# Patient Record
Sex: Female | Born: 1985 | Race: White | Hispanic: No | Marital: Married | Smoking: Never smoker
Health system: Southern US, Community
[De-identification: ages and names within clinical notes are randomized; demographics above are authoritative.]

## PROBLEM LIST (undated history)

## (undated) DIAGNOSIS — Z789 Other specified health status: Secondary | ICD-10-CM

## (undated) DIAGNOSIS — Z309 Encounter for contraceptive management, unspecified: Secondary | ICD-10-CM

## (undated) DIAGNOSIS — R519 Headache, unspecified: Secondary | ICD-10-CM

## (undated) DIAGNOSIS — J45909 Unspecified asthma, uncomplicated: Secondary | ICD-10-CM

## (undated) DIAGNOSIS — K219 Gastro-esophageal reflux disease without esophagitis: Secondary | ICD-10-CM

## (undated) DIAGNOSIS — R51 Headache: Secondary | ICD-10-CM

## (undated) HISTORY — PX: TONSILLECTOMY AND ADENOIDECTOMY: SHX28

## (undated) HISTORY — DX: Encounter for contraceptive management, unspecified: Z30.9

## (undated) HISTORY — PX: WRIST SURGERY: SHX841

## (undated) HISTORY — DX: Other specified health status: Z78.9

## (undated) HISTORY — PX: TONSILLECTOMY: SUR1361

## (undated) HISTORY — PX: CHOLECYSTECTOMY: SHX55

## (undated) HISTORY — PX: GALLBLADDER SURGERY: SHX652

## (undated) HISTORY — PX: WISDOM TOOTH EXTRACTION: SHX21

## (undated) HISTORY — DX: Unspecified asthma, uncomplicated: J45.909

---
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
## 2007-10-16 ENCOUNTER — Other Ambulatory Visit: Admission: RE | Admit: 2007-10-16 | Discharge: 2007-10-16 | Payer: Self-pay | Admitting: Obstetrics and Gynecology

## 2008-11-13 ENCOUNTER — Ambulatory Visit (HOSPITAL_COMMUNITY): Admission: RE | Admit: 2008-11-13 | Discharge: 2008-11-13 | Payer: Self-pay | Admitting: Internal Medicine

## 2009-03-19 ENCOUNTER — Other Ambulatory Visit: Admission: RE | Admit: 2009-03-19 | Discharge: 2009-03-19 | Payer: Self-pay | Admitting: Obstetrics and Gynecology

## 2010-04-09 ENCOUNTER — Other Ambulatory Visit: Admission: RE | Admit: 2010-04-09 | Discharge: 2010-04-09 | Payer: Self-pay | Admitting: Obstetrics and Gynecology

## 2010-05-06 ENCOUNTER — Ambulatory Visit (HOSPITAL_COMMUNITY): Admission: RE | Admit: 2010-05-06 | Discharge: 2010-05-06 | Payer: Self-pay | Admitting: Internal Medicine

## 2010-05-15 ENCOUNTER — Ambulatory Visit (HOSPITAL_COMMUNITY): Admission: RE | Admit: 2010-05-15 | Discharge: 2010-05-15 | Payer: Self-pay | Admitting: General Surgery

## 2011-03-01 LAB — PREGNANCY, URINE: Preg Test, Ur: NEGATIVE

## 2011-03-22 ENCOUNTER — Emergency Department (HOSPITAL_COMMUNITY)
Admission: EM | Admit: 2011-03-22 | Discharge: 2011-03-22 | Disposition: A | Discharge status: Home / Self Care | Payer: PRIVATE HEALTH INSURANCE | Attending: Emergency Medicine | Admitting: Emergency Medicine

## 2011-03-22 ENCOUNTER — Emergency Department (HOSPITAL_COMMUNITY): Payer: PRIVATE HEALTH INSURANCE

## 2011-03-22 DIAGNOSIS — B9689 Other specified bacterial agents as the cause of diseases classified elsewhere: Secondary | ICD-10-CM | POA: Insufficient documentation

## 2011-03-22 DIAGNOSIS — R1032 Left lower quadrant pain: Secondary | ICD-10-CM | POA: Insufficient documentation

## 2011-03-22 DIAGNOSIS — A499 Bacterial infection, unspecified: Secondary | ICD-10-CM | POA: Insufficient documentation

## 2011-03-22 DIAGNOSIS — Q438 Other specified congenital malformations of intestine: Secondary | ICD-10-CM | POA: Insufficient documentation

## 2011-03-22 DIAGNOSIS — N760 Acute vaginitis: Secondary | ICD-10-CM | POA: Insufficient documentation

## 2011-03-22 LAB — BASIC METABOLIC PANEL
BUN: 7 mg/dL (ref 6–23)
Chloride: 106 mEq/L (ref 96–112)
Creatinine, Ser: 0.72 mg/dL (ref 0.4–1.2)
GFR calc Af Amer: >60 mL/min (ref >60)
GFR calc non Af Amer: >60 mL/min (ref >60)
Potassium: 3.9 mEq/L (ref 3.5–5.1)

## 2011-03-22 LAB — URINALYSIS, ROUTINE W REFLEX MICROSCOPIC
Bilirubin Urine: NEGATIVE
Glucose, UA: NEGATIVE mg/dL
Glucose, UA: NEGATIVE mg/dL
Ketones, ur: NEGATIVE mg/dL
Ketones, ur: NEGATIVE mg/dL
Leukocytes, UA: NEGATIVE
Nitrite: NEGATIVE
Nitrite: NEGATIVE
Protein, ur: NEGATIVE mg/dL
Protein, ur: NEGATIVE mg/dL
Specific Gravity, Urine: 1.01 (ref 1.005–1.030)
Urobilinogen, UA: 0.2 mg/dL (ref 0.0–1.0)
pH: 6 (ref 5.0–8.0)
pH: 6 (ref 5.0–8.0)

## 2011-03-22 LAB — CBC
Hemoglobin: 13.8 g/dL (ref 12.0–15.0)
MCH: 27.2 pg (ref 26.0–34.0)
Platelets: 247 10*3/uL (ref 150–400)
RBC: 5.08 MIL/uL (ref 3.87–5.11)
WBC: 8.8 10*3/uL (ref 4.0–10.5)

## 2011-03-22 LAB — WET PREP, GENITAL: Yeast Wet Prep HPF POC: NONE SEEN

## 2011-03-22 LAB — POCT PREGNANCY, URINE: Preg Test, Ur: NEGATIVE

## 2011-03-22 LAB — DIFFERENTIAL
Basophils Absolute: 0 10*3/uL (ref 0.0–0.1)
Basophils Relative: 0 % (ref 0–1)
Eosinophils Absolute: 0.1 10*3/uL (ref 0.0–0.7)
Monocytes Relative: 8 % (ref 3–12)
Neutro Abs: 5.9 10*3/uL (ref 1.7–7.7)
Neutrophils Relative %: 67 % (ref 43–77)

## 2011-03-22 LAB — URINE MICROSCOPIC-ADD ON

## 2011-03-22 MED ORDER — IOHEXOL 300 MG/ML  SOLN
100.0000 mL | Freq: Once | INTRAMUSCULAR | Status: AC | PRN
  Administered 2011-03-22: 100 mL via INTRAVENOUS

## 2011-06-24 ENCOUNTER — Other Ambulatory Visit (HOSPITAL_COMMUNITY)
Admission: RE | Admit: 2011-06-24 | Discharge: 2011-06-24 | Disposition: A | Discharge status: Home / Self Care | Payer: PRIVATE HEALTH INSURANCE | Source: Ambulatory Visit | Attending: Obstetrics and Gynecology | Admitting: Obstetrics and Gynecology

## 2011-06-24 ENCOUNTER — Other Ambulatory Visit: Payer: Self-pay | Admitting: Adult Health

## 2011-06-24 DIAGNOSIS — Z113 Encounter for screening for infections with a predominantly sexual mode of transmission: Secondary | ICD-10-CM | POA: Insufficient documentation

## 2011-06-24 DIAGNOSIS — Z01419 Encounter for gynecological examination (general) (routine) without abnormal findings: Secondary | ICD-10-CM | POA: Insufficient documentation

## 2013-12-21 ENCOUNTER — Telehealth: Payer: Self-pay | Admitting: Adult Health

## 2013-12-21 ENCOUNTER — Other Ambulatory Visit: Payer: Self-pay | Admitting: Adult Health

## 2013-12-21 NOTE — Telephone Encounter (Signed)
Spoke with pt and let her know Sprintec Rx had been sent to [facility] [place]. Call transferred to front desk to schedule yearly exam. JSY

## 2014-01-15 ENCOUNTER — Other Ambulatory Visit: Payer: Self-pay | Admitting: Adult Health

## 2014-04-26 ENCOUNTER — Encounter: Payer: Self-pay | Admitting: Obstetrics & Gynecology

## 2014-04-26 ENCOUNTER — Ambulatory Visit (INDEPENDENT_AMBULATORY_CARE_PROVIDER_SITE_OTHER): Payer: 59 | Admitting: Obstetrics & Gynecology

## 2014-04-26 VITALS — BP 118/70 | Ht 67.0 in | Wt 177.0 lb

## 2014-04-26 DIAGNOSIS — Z3201 Encounter for pregnancy test, result positive: Secondary | ICD-10-CM

## 2014-04-26 LAB — POCT URINE PREGNANCY: Preg Test, Ur: POSITIVE

## 2014-04-26 NOTE — Progress Notes (Signed)
Pt here for pregnancy test. Positive result. Advised can have cramping and spotting in early pregnancy. That's just everything getting settled into place. Pt reports some cramping, but not severe. Advised to increase fluids and take it easy when she feels the cramps. Pt reports some nausea, but not enough to need med at this time. Advised to call if cramping gets worse or if she starts spotting.

## 2014-05-15 ENCOUNTER — Other Ambulatory Visit: Payer: Self-pay | Admitting: Obstetrics and Gynecology

## 2014-05-15 DIAGNOSIS — O3680X0 Pregnancy with inconclusive fetal viability, not applicable or unspecified: Secondary | ICD-10-CM

## 2014-05-21 ENCOUNTER — Other Ambulatory Visit: Payer: 59

## 2014-05-21 ENCOUNTER — Encounter: Payer: 59 | Admitting: Women's Health

## 2014-05-21 ENCOUNTER — Ambulatory Visit (INDEPENDENT_AMBULATORY_CARE_PROVIDER_SITE_OTHER): Payer: 59

## 2014-05-21 ENCOUNTER — Other Ambulatory Visit: Payer: Self-pay | Admitting: Obstetrics and Gynecology

## 2014-05-21 DIAGNOSIS — O26849 Uterine size-date discrepancy, unspecified trimester: Secondary | ICD-10-CM

## 2014-05-21 DIAGNOSIS — O3680X0 Pregnancy with inconclusive fetal viability, not applicable or unspecified: Secondary | ICD-10-CM

## 2014-05-21 NOTE — Progress Notes (Signed)
U/S-single IUP with +FCA noted, FHR-168 bpm, CRL c/w 9+0wks EDD 12/24/2014, cx appears long and closed, bilateral adnexa appears wnl

## 2014-06-04 ENCOUNTER — Encounter: Payer: Self-pay | Admitting: Women's Health

## 2014-06-04 ENCOUNTER — Ambulatory Visit (INDEPENDENT_AMBULATORY_CARE_PROVIDER_SITE_OTHER): Payer: 59 | Admitting: Women's Health

## 2014-06-04 ENCOUNTER — Other Ambulatory Visit (HOSPITAL_COMMUNITY)
Admission: RE | Admit: 2014-06-04 | Discharge: 2014-06-04 | Disposition: A | Discharge status: Home / Self Care | Payer: 59 | Source: Ambulatory Visit | Attending: Obstetrics & Gynecology | Admitting: Obstetrics & Gynecology

## 2014-06-04 VITALS — BP 110/64 | Wt 178.0 lb

## 2014-06-04 DIAGNOSIS — Z36 Encounter for antenatal screening of mother: Secondary | ICD-10-CM

## 2014-06-04 DIAGNOSIS — Z331 Pregnant state, incidental: Secondary | ICD-10-CM

## 2014-06-04 DIAGNOSIS — Z01419 Encounter for gynecological examination (general) (routine) without abnormal findings: Secondary | ICD-10-CM

## 2014-06-04 DIAGNOSIS — Z1389 Encounter for screening for other disorder: Secondary | ICD-10-CM

## 2014-06-04 DIAGNOSIS — Z3400 Encounter for supervision of normal first pregnancy, unspecified trimester: Secondary | ICD-10-CM | POA: Insufficient documentation

## 2014-06-04 DIAGNOSIS — Z3401 Encounter for supervision of normal first pregnancy, first trimester: Secondary | ICD-10-CM

## 2014-06-04 DIAGNOSIS — Z113 Encounter for screening for infections with a predominantly sexual mode of transmission: Secondary | ICD-10-CM | POA: Insufficient documentation

## 2014-06-04 LAB — URINALYSIS, ROUTINE W REFLEX MICROSCOPIC
Bilirubin Urine: NEGATIVE
GLUCOSE, UA: NEGATIVE mg/dL
Hgb urine dipstick: NEGATIVE
Ketones, ur: NEGATIVE mg/dL
Nitrite: NEGATIVE
PROTEIN: NEGATIVE mg/dL
SPECIFIC GRAVITY, URINE: 1.01 (ref 1.005–1.030)
Urobilinogen, UA: 0.2 mg/dL (ref 0.0–1.0)
pH: 6.5 (ref 5.0–8.0)

## 2014-06-04 LAB — POCT URINALYSIS DIPSTICK
Glucose, UA: NEGATIVE
Ketones, UA: NEGATIVE
NITRITE UA: NEGATIVE
Protein, UA: NEGATIVE
RBC UA: NEGATIVE

## 2014-06-04 NOTE — Patient Instructions (Signed)
Nausea & Vomiting  Have saltine crackers or pretzels by your bed and eat a few bites before you raise your head out of bed in the morning  Eat small frequent meals throughout the day instead of large meals  Drink plenty of fluids throughout the day to stay hydrated, just don't drink a lot of fluids with your meals.  This can make your stomach fill up faster making you feel sick  Do not brush your teeth right after you eat  Products with real ginger are good for nausea, like ginger ale and ginger hard candy Make sure it says made with real ginger!  Sucking on sour candy like lemon heads is also good for nausea  If your prenatal vitamins make you nauseated, take them at night so you will sleep through the nausea  If you feel like you need medicine for the nausea & vomiting please let us know  If you are unable to keep any fluids or food down please let us know   Constipation  Drink plenty of fluid, preferably water, throughout the day  Eat foods high in fiber such as fruits, vegetables, and grains  Exercise, such as walking, is a good way to keep your bowels regular  Drink warm fluids, especially warm prune juice, or decaf coffee  Eat a 1/2 cup of real oatmeal (not instant), 1/2 cup applesauce, and 1/2-1 cup warm prune juice every day  If needed, you may take Colace (docusate sodium) stool softener once or twice a day to help keep the stool soft. If you are pregnant, wait until you are out of your first trimester (12-14 weeks of pregnancy)  If you still are having problems with constipation, you may take Miralax once daily as needed to help keep your bowels regular.  If you are pregnant, wait until you are out of your first trimester (12-14 weeks of pregnancy)    Pregnancy - First Trimester During sexual intercourse, millions of sperm go into the vagina. Only 1 sperm will penetrate and fertilize the female egg while it is in the Fallopian tube. One week later, the fertilized egg  implants into the wall of the uterus. An embryo begins to develop into a baby. At 6 to 8 weeks, the eyes and face are formed and the heartbeat can be seen on ultrasound. At the end of 12 weeks (first trimester), all the baby's organs are formed. Now that you are pregnant, you will want to do everything you can to have a healthy baby. Two of the most important things are to get good prenatal care and follow your caregiver's instructions. Prenatal care is all the medical care you receive before the baby's birth. It is given to prevent, find, and treat problems during the pregnancy and childbirth. PRENATAL EXAMS  During prenatal visits, your weight, blood pressure, and urine are checked. This is done to make sure you are healthy and progressing normally during the pregnancy.  A pregnant woman should gain 25 to 35 pounds during the pregnancy. However, if you are overweight or underweight, your caregiver will advise you regarding your weight.  Your caregiver will ask and answer questions for you.  Blood work, cervical cultures, other necessary tests, and a Pap test are done during your prenatal exams. These tests are done to check on your health and the probable health of your baby. Tests are strongly recommended and done for HIV with your permission. This is the virus that causes AIDS. These tests are done because medicines   be given to help prevent your baby from being born with this infection should you have been infected without knowing it. Blood work is also used to find out your blood type, previous infections, and follow your blood levels (hemoglobin).  Low hemoglobin (anemia) is common during pregnancy. Iron and vitamins are given to help prevent this. Later in the pregnancy, blood tests for diabetes will be done along with any other tests if any problems develop.  You may need other tests to make sure you and the baby are doing well. CHANGES DURING THE FIRST TRIMESTER  Your body goes through  many changes during pregnancy. They vary from person to person. Talk to your caregiver about changes you notice and are concerned about. Changes can include:  Your menstrual period stops.  The egg and sperm carry the genes that determine what you look like. Genes from you and your partner are forming a baby. The female genes determine whether the baby is a boy or a girl.  Your body increases in girth and you may feel bloated.  Feeling sick to your stomach (nauseous) and throwing up (vomiting). If the vomiting is uncontrollable, call your caregiver.  Your breasts will begin to enlarge and become tender.  Your nipples may stick out more and become darker.  The need to urinate more. Painful urination may mean you have a bladder infection.  Tiring easily.  Loss of appetite.  Cravings for certain kinds of food.  At first, you may gain or lose a couple of pounds.  You may have changes in your emotions from day to day (excited to be pregnant or concerned something may go wrong with the pregnancy and baby).  You may have more vivid and strange dreams. HOME CARE INSTRUCTIONS   It is very important to avoid all smoking, alcohol and non-prescribed drugs during your pregnancy. These affect the formation and growth of the baby. Avoid chemicals while pregnant to ensure the delivery of a healthy infant.  Start your prenatal visits by the 12th week of pregnancy. They are usually scheduled monthly at first, then more often in the last 2 months before delivery. Keep your caregiver's appointments. Follow your caregiver's instructions regarding medicine use, blood and lab tests, exercise, and diet.  During pregnancy, you are providing food for you and your baby. Eat regular, well-balanced meals. Choose foods such as meat, fish, milk and other low fat dairy products, vegetables, fruits, and whole-grain breads and cereals. Your caregiver will tell you of the ideal weight gain.  You can help morning  sickness by keeping soda crackers at the bedside. Eat a couple before arising in the morning. You may want to use the crackers without salt on them.  Eating 4 to 5 small meals rather than 3 large meals a day also may help the nausea and vomiting.  Drinking liquids between meals instead of during meals also seems to help nausea and vomiting.  A physical sexual relationship may be continued throughout pregnancy if there are no other problems. Problems may be early (premature) leaking of amniotic fluid from the membranes, vaginal bleeding, or belly (abdominal) pain.  Exercise regularly if there are no restrictions. Check with your caregiver or physical therapist if you are unsure of the safety of some of your exercises. Greater weight gain will occur in the last 2 trimesters of pregnancy. Exercising will help:  Control your weight.  Keep you in shape.  Prepare you for labor and delivery.  Help you lose your pregnancy weight  weight after you deliver your baby.  Wear a good support or jogging bra for breast tenderness during pregnancy. This may help if worn during sleep too.  Ask when prenatal classes are available. Begin classes when they are offered.  Do not use hot tubs, steam rooms, or saunas.  Wear your seat belt when driving. This protects you and your baby if you are in an accident.  Avoid raw meat, uncooked cheese, cat litter boxes, and soil used by cats throughout the pregnancy. These carry germs that can cause birth defects in the baby.  The first trimester is a good time to visit your dentist for your dental health. Getting your teeth cleaned is okay. Use a softer toothbrush and brush gently during pregnancy.  Ask for help if you have financial, counseling, or nutritional needs during pregnancy. Your caregiver will be able to offer counseling for these needs as well as refer you for other special needs.  Do not take any medicines or herbs unless told by your caregiver.  Inform your  caregiver if there is any mental or physical domestic violence.  Make a list of emergency phone numbers of family, friends, hospital, and police and fire departments.  Write down your questions. Take them to your prenatal visit.  Do not douche.  Do not cross your legs.  If you have to stand for long periods of time, rotate you feet or take small steps in a circle.  You may have more vaginal secretions that may require a sanitary pad. Do not use tampons or scented sanitary pads. MEDICINES AND DRUG USE IN PREGNANCY  Take prenatal vitamins as directed. The vitamin should contain 1 milligram of folic acid. Keep all vitamins out of reach of children. Only a couple vitamins or tablets containing iron may be fatal to a baby or young child when ingested.  Avoid use of all medicines, including herbs, over-the-counter medicines, not prescribed or suggested by your caregiver. Only take over-the-counter or prescription medicines for pain, discomfort, or fever as directed by your caregiver. Do not use aspirin, ibuprofen, or naproxen unless directed by your caregiver.  Let your caregiver also know about herbs you may be using.  Alcohol is related to a number of birth defects. This includes fetal alcohol syndrome. All alcohol, in any form, should be avoided completely. Smoking will cause low birth rate and premature babies.  Street or illegal drugs are very harmful to the baby. They are absolutely forbidden. A baby born to an addicted mother will be addicted at birth. The baby will go through the same withdrawal an adult does.  Let your caregiver know about any medicines that you have to take and for what reason you take them. SEEK MEDICAL CARE IF:  You have any concerns or worries during your pregnancy. It is better to call with your questions if you feel they cannot wait, rather than worry about them. SEEK IMMEDIATE MEDICAL CARE IF:   An unexplained oral temperature above 102 F (38.9 C) develops,  or as your caregiver suggests.  You have leaking of fluid from the vagina (birth canal). If leaking membranes are suspected, take your temperature and inform your caregiver of this when you call.  There is vaginal spotting or bleeding. Notify your caregiver of the amount and how many pads are used.  You develop a bad smelling vaginal discharge with a change in the color.  You continue to feel sick to your stomach (nauseated) and have no relief from remedies suggested. You   vomit blood or coffee ground-like materials.  You lose more than 2 pounds of weight in 1 week.  You gain more than 2 pounds of weight in 1 week and you notice swelling of your face, hands, feet, or legs.  You gain 5 pounds or more in 1 week (even if you do not have swelling of your hands, face, legs, or feet).  You get exposed to German measles and have never had them.  You are exposed to fifth disease or chickenpox.  You develop belly (abdominal) pain. Round ligament discomfort is a common non-cancerous (benign) cause of abdominal pain in pregnancy. Your caregiver still must evaluate this.  You develop headache, fever, diarrhea, pain with urination, or shortness of breath.  You fall or are in a car accident or have any kind of trauma.  There is mental or physical violence in your home. Document Released: 11/23/2001 Document Revised: 08/23/2012 Document Reviewed: 10/09/2013 Exit[employer] Patient Information 2015 [employer]. This information is not intended to replace advice given to you by your health care Kylie White. Make sure you discuss any questions you have with your health care Kylie White.  

## 2014-06-04 NOTE — Progress Notes (Signed)
  Subjective:  Kylie White is a 28 y.o. G1P0 Caucasian female at 3072w0d by 9[facility]sw[facility] u/s, being seen today for her first obstetrical visit.  Her obstetrical history is significant for primigravida.  Pregnancy history fully reviewed.  Patient reports n/v, better last 3 d. Some leg cramps. Denies vb, cramping, uti s/s, abnormal/malodorous vag d/c, or vulvovaginal itching/irritation.  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 Asthma     exercised induced   Past Surgical History  Procedure Laterality Date  . Gallbladder surgery    . Tonsillectomy and adenoidectomy    . Wrist surgery Left    Family History  Problem Relation Age of Onset  . Hypertension Maternal Grandmother   . Cancer Maternal Grandfather     skin  . Hypertension Mother   . Hypercholesterolemia Mother   . Heart disease Paternal Grandfather     heart attack    Exam   System:     General: Well developed & nourished, no acute distress   Skin: Warm & dry, normal coloration and turgor, no rashes   Neurologic: Alert & oriented, normal mood   Cardiovascular: Regular rate & rhythm   Respiratory: Effort & rate normal, LCTAB, acyanotic   Abdomen: Soft, non tender   Extremities: normal strength, tone   Pelvic Exam:    Perineum: Normal perineum   Vulva: Normal, no lesions   Vagina:  Normal mucosa, normal discharge   Cervix: Normal, bulbous, appears closed   Uterus: Normal size/shape/contour for GA   Thin prep pap smear obtained high risk HPV cotesting FHR: 168 via doppler   Assessment:    Pregnancy: G1P0 Patient Active Problem List   Diagnosis Date Noted  . Supervision of normal first pregnancy 06/04/2014    Priority: High    2972w0d G1P0 New OB visit N/V of pregnancy    Plan:  Initial labs drawn Continue prenatal vitamins Problem list reviewed and updated Reviewed n/v relief measures and warning s/s to report- declines need for meds at this time Reviewed recommended weight gain based on pre-gravid BMI Encouraged well-balanced diet Genetic Screening discussed Integrated Screen: requested Cystic fibrosis screening discussed requested Ultrasound discussed; fetal survey: requested Follow up in 1 weeks for 1st IT/NT (no visit), then 4wks for 2nd IT and visit [facility] completed Reviewed leg cramp relief measures  Marge DuncansBooker, Kimberly Randall CNM, [id number] 06/04/2014 2:20 PM

## 2014-06-05 ENCOUNTER — Encounter: Payer: Self-pay | Admitting: Women's Health

## 2014-06-05 DIAGNOSIS — Z2839 Other underimmunization status: Secondary | ICD-10-CM | POA: Insufficient documentation

## 2014-06-05 DIAGNOSIS — Z283 Underimmunization status: Secondary | ICD-10-CM | POA: Insufficient documentation

## 2014-06-05 DIAGNOSIS — O9989 Other specified diseases and conditions complicating pregnancy, childbirth and the puerperium: Secondary | ICD-10-CM

## 2014-06-05 LAB — VARICELLA ZOSTER ANTIBODY, IGG: VARICELLA IGG: 250.1 {index} — AB (ref <135.00)

## 2014-06-05 LAB — DRUG SCREEN, URINE, NO CONFIRMATION
AMPHETAMINE SCRN UR: NEGATIVE
Barbiturate Quant, Ur: NEGATIVE
Benzodiazepines.: NEGATIVE
COCAINE METABOLITES: NEGATIVE
CREATININE, U: 38 mg/dL
MARIJUANA METABOLITE: NEGATIVE
Methadone: NEGATIVE
OPIATE SCREEN, URINE: NEGATIVE
PHENCYCLIDINE (PCP): NEGATIVE
Propoxyphene: NEGATIVE

## 2014-06-05 LAB — URINALYSIS, MICROSCOPIC ONLY
BACTERIA UA: NONE SEEN
Casts: NONE SEEN
Crystals: NONE SEEN

## 2014-06-05 LAB — HEPATITIS B SURFACE ANTIGEN: Hepatitis B Surface Ag: NEGATIVE

## 2014-06-05 LAB — RPR

## 2014-06-05 LAB — RUBELLA SCREEN: Rubella: 0.31 Index (ref <0.90)

## 2014-06-05 LAB — CBC
HCT: 40.8 % (ref 36.0–46.0)
Hemoglobin: 14 g/dL (ref 12.0–15.0)
MCH: 28.8 pg (ref 26.0–34.0)
MCHC: 34.3 g/dL (ref 30.0–36.0)
MCV: 84 fL (ref 78.0–100.0)
PLATELETS: 287 10*3/uL (ref 150–400)
RBC: 4.86 MIL/uL (ref 3.87–5.11)
RDW: 13.5 % (ref 11.5–15.5)
WBC: 10.2 10*3/uL (ref 4.0–10.5)

## 2014-06-05 LAB — ABO AND RH: RH TYPE: POSITIVE

## 2014-06-05 LAB — HIV ANTIBODY (ROUTINE TESTING W REFLEX): HIV: NONREACTIVE

## 2014-06-05 LAB — OXYCODONE SCREEN, UA, RFLX CONFIRM: OXYCODONE SCRN UR: NEGATIVE ng/mL

## 2014-06-05 LAB — TSH: TSH: 1.641 u[IU]/mL (ref 0.350–4.500)

## 2014-06-05 LAB — ANTIBODY SCREEN: ANTIBODY SCREEN: NEGATIVE

## 2014-06-06 LAB — URINE CULTURE
COLONY COUNT: NO GROWTH
ORGANISM ID, BACTERIA: NO GROWTH

## 2014-06-06 LAB — CYTOLOGY - PAP

## 2014-06-07 ENCOUNTER — Encounter: Payer: Self-pay | Admitting: Women's Health

## 2014-06-07 LAB — CYSTIC FIBROSIS DIAGNOSTIC STUDY

## 2014-06-08 ENCOUNTER — Encounter: Payer: Self-pay | Admitting: Women's Health

## 2014-06-12 ENCOUNTER — Other Ambulatory Visit: Payer: 59

## 2014-06-12 ENCOUNTER — Ambulatory Visit (INDEPENDENT_AMBULATORY_CARE_PROVIDER_SITE_OTHER): Payer: 59

## 2014-06-12 DIAGNOSIS — Z3401 Encounter for supervision of normal first pregnancy, first trimester: Secondary | ICD-10-CM

## 2014-06-12 DIAGNOSIS — Z36 Encounter for antenatal screening of mother: Secondary | ICD-10-CM

## 2014-06-12 NOTE — Progress Notes (Signed)
U/S(12+1wks)-single active fetus, CRL c/w dates, NB present, NT-1.8041mm, FHR-159 bpm, cx appears closed (3.2cm), bilateral adnexa appears WNL, anterior Gr 0 placenta,

## 2014-06-17 ENCOUNTER — Telehealth: Payer: Self-pay | Admitting: Adult Health

## 2014-06-17 MED ORDER — PROMETHAZINE HCL 25 MG PO TABS: 25.0000 mg | ORAL_TABLET | Freq: Four times a day (QID) | ORAL | Status: DC | PRN

## 2014-06-17 NOTE — Telephone Encounter (Signed)
Left message will rx phenergan, try some caffeine with tylenol can call as needed

## 2014-06-17 NOTE — Telephone Encounter (Signed)
Pt states she is having headaches every day, she has been taking two tylenol every morning and two at bedtime with minimal relief and is wondering if there is anything else she can try, she is also wanting something for nausea sent to [facility] in [place], please advise.

## 2014-06-19 LAB — MATERNAL SCREEN, INTEGRATED #1

## 2014-07-02 ENCOUNTER — Encounter: Payer: Self-pay | Admitting: Women's Health

## 2014-07-02 ENCOUNTER — Ambulatory Visit (INDEPENDENT_AMBULATORY_CARE_PROVIDER_SITE_OTHER): Payer: Self-pay | Admitting: Women's Health

## 2014-07-02 VITALS — BP 112/70 | Wt 186.0 lb

## 2014-07-02 DIAGNOSIS — Z1389 Encounter for screening for other disorder: Secondary | ICD-10-CM

## 2014-07-02 DIAGNOSIS — Z3400 Encounter for supervision of normal first pregnancy, unspecified trimester: Secondary | ICD-10-CM

## 2014-07-02 DIAGNOSIS — Z331 Pregnant state, incidental: Secondary | ICD-10-CM

## 2014-07-02 DIAGNOSIS — Z3402 Encounter for supervision of normal first pregnancy, second trimester: Secondary | ICD-10-CM

## 2014-07-02 LAB — POCT URINALYSIS DIPSTICK
Blood, UA: NEGATIVE
Glucose, UA: NEGATIVE
Ketones, UA: NEGATIVE
Nitrite, UA: NEGATIVE
PROTEIN UA: NEGATIVE

## 2014-07-02 NOTE — Progress Notes (Signed)
Low-risk OB appointment G1P0 3241w0d Estimated Date of Delivery: 12/24/14 BP 112/70  Wt 186 lb (84.369 kg)  BP, weight, and urine reviewed.  Refer to obstetrical flow sheet for FH & FHR.  Denies craming, of, vb, or uti s/s. No complaints. Reviewed warning s/s to report. Plan:  Continue routine obstetrical care  F/U in 4wks for OB appointment anatomy u/s  2nd IT today

## 2014-07-02 NOTE — Patient Instructions (Signed)
Second Trimester of Pregnancy The second trimester is from week 13 through week 28, months 4 through 6. The second trimester is often a time when you feel your best. Your body has also adjusted to being pregnant, and you begin to feel better physically. Usually, morning sickness has lessened or quit completely, you may have more energy, and you may have an increase in appetite. The second trimester is also a time when the fetus is growing rapidly. At the end of the sixth month, the fetus is about 9 inches long and weighs about 1 pounds. You will likely begin to feel the baby move (quickening) between 18 and 20 weeks of the pregnancy. BODY CHANGES Your body goes through many changes during pregnancy. The changes vary from woman to woman.   Your weight will continue to increase. You will notice your lower abdomen bulging out.  You may begin to get stretch marks on your hips, abdomen, and breasts.  You may develop headaches that can be relieved by medicines approved by your health care provider.  You may urinate more often because the fetus is pressing on your bladder.  You may develop or continue to have heartburn as a result of your pregnancy.  You may develop constipation because certain hormones are causing the muscles that push waste through your intestines to slow down.  You may develop hemorrhoids or swollen, bulging veins (varicose veins).  You may have back pain because of the weight gain and pregnancy hormones relaxing your joints between the bones in your pelvis and as a result of a shift in weight and the muscles that support your balance.  Your breasts will continue to grow and be tender.  Your gums may bleed and may be sensitive to brushing and flossing.  Dark spots or blotches (chloasma, mask of pregnancy) may develop on your face. This will likely fade after the baby is born.  A dark line from your belly button to the pubic area (linea nigra) may appear. This will likely fade  after the baby is born.  You may have changes in your hair. These can include thickening of your hair, rapid growth, and changes in texture. Some women also have hair loss during or after pregnancy, or hair that feels dry or thin. Your hair will most likely return to normal after your baby is born. WHAT TO EXPECT AT YOUR PRENATAL VISITS During a routine prenatal visit:  You will be weighed to make sure you and the fetus are growing normally.  Your blood pressure will be taken.  Your abdomen will be measured to track your baby's growth.  The fetal heartbeat will be listened to.  Any test results from the previous visit will be discussed. Your health care provider may ask you:  How you are feeling.  If you are feeling the baby move.  If you have had any abnormal symptoms, such as leaking fluid, bleeding, severe headaches, or abdominal cramping.  If you have any questions. Other tests that may be performed during your second trimester include:  Blood tests that check for:  Low iron levels (anemia).  Gestational diabetes (between 24 and 28 weeks).  Rh antibodies.  Urine tests to check for infections, diabetes, or protein in the urine.  An ultrasound to confirm the proper growth and development of the baby.  An amniocentesis to check for possible genetic problems.  Fetal screens for spina bifida and Down syndrome. HOME CARE INSTRUCTIONS   Avoid all smoking, herbs, alcohol, and unprescribed   drugs. These chemicals affect the formation and growth of the baby.  Follow your health care provider's instructions regarding medicine use. There are medicines that are either safe or unsafe to take during pregnancy.  Exercise only as directed by your health care provider. Experiencing uterine cramps is a good sign to stop exercising.  Continue to eat regular, healthy meals.  Wear a good support bra for breast tenderness.  Do not use hot tubs, steam rooms, or saunas.  Wear your  seat belt at all times when driving.  Avoid raw meat, uncooked cheese, cat litter boxes, and soil used by cats. These carry germs that can cause birth defects in the baby.  Take your prenatal vitamins.  Try taking a stool softener (if your health care provider approves) if you develop constipation. Eat more high-fiber foods, such as fresh vegetables or fruit and whole grains. Drink plenty of fluids to keep your urine clear or pale yellow.  Take warm sitz baths to soothe any pain or discomfort caused by hemorrhoids. Use hemorrhoid cream if your health care provider approves.  If you develop varicose veins, wear support hose. Elevate your feet for 15 minutes, 3-4 times a day. Limit salt in your diet.  Avoid heavy lifting, wear low heel shoes, and practice good posture.  Rest with your legs elevated if you have leg cramps or low back pain.  Visit your dentist if you have not gone yet during your pregnancy. Use a soft toothbrush to brush your teeth and be gentle when you floss.  A sexual relationship may be continued unless your health care provider directs you otherwise.  Continue to go to all your prenatal visits as directed by your health care provider. SEEK MEDICAL CARE IF:   You have dizziness.  You have mild pelvic cramps, pelvic pressure, or nagging pain in the abdominal area.  You have persistent nausea, vomiting, or diarrhea.  You have a bad smelling vaginal discharge.  You have pain with urination. SEEK IMMEDIATE MEDICAL CARE IF:   You have a fever.  You are leaking fluid from your vagina.  You have spotting or bleeding from your vagina.  You have severe abdominal cramping or pain.  You have rapid weight gain or loss.  You have shortness of breath with chest pain.  You notice sudden or extreme swelling of your face, hands, ankles, feet, or legs.  You have not felt your baby move in over an hour.  You have severe headaches that do not go away with  medicine.  You have vision changes. Document Released: 11/23/2001 Document Revised: 12/04/2013 Document Reviewed: 01/30/2013 Exit[employer] Patient Information 2015 [employer]. This information is not intended to replace advice given to you by your health care provider. Make sure you discuss any questions you have with your health care provider.  

## 2014-07-05 LAB — MATERNAL SCREEN, INTEGRATED #2
AFP MOM MAT SCREEN: 1.23
AFP, Serum: 30.8 ng/mL
Age risk Down Syndrome: 1:980 {titer}
CROWN RUMP LENGTH MAT SCREEN 2: 59.3 mm
Calculated Gestational Age: 15.1
ESTRIOL FREE MAT SCREEN: 0.36 ng/mL
ESTRIOL MOM MAT SCREEN: 0.61
HCG, SERUM MAT SCREEN: 80.5 [IU]/mL
Inhibin A Dimeric: 202 pg/mL
Inhibin A MoM: 1.24
MSS Down Syndrome: <1:5000 {titer}
MSS Trisomy 18 Risk: <1:5000 {titer}
NT MoM: 1.03
NUCHAL TRANSLUCENCY MAT SCREEN 2: 1.41 mm
Number of fetuses: 1
PAPP-A MoM: 1.76
PAPP-A: 842 ng/mL
Rish for ONTD: 1:3700 {titer}
hCG MoM: 2.11

## 2014-07-09 ENCOUNTER — Encounter: Payer: Self-pay | Admitting: Women's Health

## 2014-07-30 ENCOUNTER — Encounter: Payer: Self-pay | Admitting: Women's Health

## 2014-07-30 ENCOUNTER — Ambulatory Visit (INDEPENDENT_AMBULATORY_CARE_PROVIDER_SITE_OTHER): Payer: 59

## 2014-07-30 ENCOUNTER — Ambulatory Visit (INDEPENDENT_AMBULATORY_CARE_PROVIDER_SITE_OTHER): Payer: Self-pay | Admitting: Women's Health

## 2014-07-30 VITALS — BP 116/62 | Wt 192.0 lb

## 2014-07-30 DIAGNOSIS — O4402 Placenta previa specified as without hemorrhage, second trimester: Secondary | ICD-10-CM

## 2014-07-30 DIAGNOSIS — Z3402 Encounter for supervision of normal first pregnancy, second trimester: Secondary | ICD-10-CM

## 2014-07-30 DIAGNOSIS — Z1389 Encounter for screening for other disorder: Secondary | ICD-10-CM

## 2014-07-30 DIAGNOSIS — Z331 Pregnant state, incidental: Secondary | ICD-10-CM

## 2014-07-30 DIAGNOSIS — O4440 Low lying placenta NOS or without hemorrhage, unspecified trimester: Secondary | ICD-10-CM | POA: Insufficient documentation

## 2014-07-30 DIAGNOSIS — Z3400 Encounter for supervision of normal first pregnancy, unspecified trimester: Secondary | ICD-10-CM

## 2014-07-30 LAB — POCT URINALYSIS DIPSTICK
Blood, UA: NEGATIVE
GLUCOSE UA: NEGATIVE
Ketones, UA: NEGATIVE
Leukocytes, UA: NEGATIVE
NITRITE UA: NEGATIVE
PROTEIN UA: NEGATIVE

## 2014-07-30 NOTE — Patient Instructions (Signed)
[place] [facility]ians:  [facility] [phone]            [facility] [phone]                 [facility] [phone] (usually doesn't accept new patients unless you have family there already, you are always welcome to call and ask)             [facility] ([address] ) [phone]   [place] [facility]:   [facility]: [phone]  [facility]: [phone]   Second Trimester of Pregnancy The second trimester is from week 13 through week 28, months 4 through 6. The second trimester is often a time when you feel your best. Your body has also adjusted to being pregnant, and you begin to feel better physically. Usually, morning sickness has lessened or quit completely, you may have more energy, and you may have an increase in appetite. The second trimester is also a time when the fetus is growing rapidly. At the end of the sixth month, the fetus is about 9 inches long and weighs about 1 pounds. You will likely begin to feel the baby move (quickening) between 18 and 20 weeks of the pregnancy. BODY CHANGES Your body goes through many changes during pregnancy. The changes vary from woman to woman.   Your weight will continue to increase. You will notice your lower abdomen bulging out.  You may begin to get stretch marks on your hips, abdomen, and breasts.  You may develop headaches that can be relieved by medicines approved by your health care provider.  You may urinate more often because the fetus is pressing on your bladder.  You may develop or continue to have heartburn as a result of your pregnancy.  You may develop constipation because certain hormones are causing the muscles that push waste through your intestines to slow down.  You may develop hemorrhoids or swollen, bulging veins (varicose veins).  You may have back pain because of the weight gain and  pregnancy hormones relaxing your joints between the bones in your pelvis and as a result of a shift in weight and the muscles that support your balance.  Your breasts will continue to grow and be tender.  Your gums may bleed and may be sensitive to brushing and flossing.  Dark spots or blotches (chloasma, mask of pregnancy) may develop on your face. This will likely fade after the baby is born.  A dark line from your belly button to the pubic area (linea nigra) may appear. This will likely fade after the baby is born.  You may have changes in your hair. These can include thickening of your hair, rapid growth, and changes in texture. Some women also have hair loss during or after pregnancy, or hair that feels dry or thin. Your hair will most likely return to normal after your baby is born. WHAT TO EXPECT AT YOUR PRENATAL VISITS During a routine prenatal visit:  You will be weighed to make sure you and the fetus are growing normally.  Your blood pressure will be taken.  Your abdomen will be measured to track your baby's growth.  The fetal heartbeat will be listened to.  Any test results from the previous visit will be discussed. Your health care provider may ask you:  How you are feeling.  If you are feeling the baby move.  If you have had any abnormal symptoms, such as leaking fluid,   bleeding, severe headaches, or abdominal cramping.  If you have any questions. Other tests that may be performed during your second trimester include:  Blood tests that check for:  Low iron levels (anemia).  Gestational diabetes (between 24 and 28 weeks).  Rh antibodies.  Urine tests to check for infections, diabetes, or protein in the urine.  An ultrasound to confirm the proper growth and development of the baby.  An amniocentesis to check for possible genetic problems.  Fetal screens for spina bifida and Down syndrome. HOME CARE INSTRUCTIONS   Avoid all smoking, herbs, alcohol, and  unprescribed drugs. These chemicals affect the formation and growth of the baby.  Follow your health care provider's instructions regarding medicine use. There are medicines that are either safe or unsafe to take during pregnancy.  Exercise only as directed by your health care provider. Experiencing uterine cramps is a good sign to stop exercising.  Continue to eat regular, healthy meals.  Wear a good support bra for breast tenderness.  Do not use hot tubs, steam rooms, or saunas.  Wear your seat belt at all times when driving.  Avoid raw meat, uncooked cheese, cat litter boxes, and soil used by cats. These carry germs that can cause birth defects in the baby.  Take your prenatal vitamins.  Try taking a stool softener (if your health care provider approves) if you develop constipation. Eat more high-fiber foods, such as fresh vegetables or fruit and whole grains. Drink plenty of fluids to keep your urine clear or pale yellow.  Take warm sitz baths to soothe any pain or discomfort caused by hemorrhoids. Use hemorrhoid cream if your health care provider approves.  If you develop varicose veins, wear support hose. Elevate your feet for 15 minutes, 3-4 times a day. Limit salt in your diet.  Avoid heavy lifting, wear low heel shoes, and practice good posture.  Rest with your legs elevated if you have leg cramps or low back pain.  Visit your dentist if you have not gone yet during your pregnancy. Use a soft toothbrush to brush your teeth and be gentle when you floss.  A sexual relationship may be continued unless your health care provider directs you otherwise.  Continue to go to all your prenatal visits as directed by your health care provider. SEEK MEDICAL CARE IF:   You have dizziness.  You have mild pelvic cramps, pelvic pressure, or nagging pain in the abdominal area.  You have persistent nausea, vomiting, or diarrhea.  You have a bad smelling vaginal discharge.  You have  pain with urination. SEEK IMMEDIATE MEDICAL CARE IF:   You have a fever.  You are leaking fluid from your vagina.  You have spotting or bleeding from your vagina.  You have severe abdominal cramping or pain.  You have rapid weight gain or loss.  You have shortness of breath with chest pain.  You notice sudden or extreme swelling of your face, hands, ankles, feet, or legs.  You have not felt your baby move in over an hour.  You have severe headaches that do not go away with medicine.  You have vision changes. Document Released: 11/23/2001 Document Revised: 12/04/2013 Document Reviewed: 01/30/2013 Exit[employer] Patient Information 2015 [employer]. This information is not intended to replace advice given to you by your health care provider. Make sure you discuss any questions you have with your health care provider.  

## 2014-07-30 NOTE — Progress Notes (Signed)
U/S(19+0wks)-active fetus, meas c/w dates, fluid wnl, anterior Low-lying placenta (1.5cm separates int cx os from placental tip), FHR- 141 bpm, cx appears closed (4.6cm), bilateral adnexa appears WNL, no major abnl noted, female fetus, would like to reck placenta (low-lying) at ~28 weeks

## 2014-07-30 NOTE — Progress Notes (Signed)
Low-risk OB appointment G1P0 [id number] Estimated Date of Delivery: 12/24/14 BP 116/62  Wt 192 lb (87.091 kg)  BP, weight, and urine reviewed.  Refer to obstetrical flow sheet for FH & FHR.  No fm yet- anterior placenta. Denies cramping, lof, vb, or uti s/s. Lt sciatica- recommended stretching exercises Wants note to ok prenatal massage- given Reviewed today's normal anatomy u/s, has low-lying placenta 1.5cm from os. Discussed ptl s/s, fm. Plan:  Continue routine obstetrical care  F/U in 4wks for OB appointment

## 2014-08-27 ENCOUNTER — Encounter: Payer: 59 | Admitting: Women's Health

## 2014-08-28 ENCOUNTER — Ambulatory Visit (INDEPENDENT_AMBULATORY_CARE_PROVIDER_SITE_OTHER): Payer: 59 | Admitting: Women's Health

## 2014-08-28 ENCOUNTER — Encounter: Payer: Self-pay | Admitting: Women's Health

## 2014-08-28 VITALS — BP 110/64 | Wt 200.0 lb

## 2014-08-28 DIAGNOSIS — O4400 Placenta previa specified as without hemorrhage, unspecified trimester: Secondary | ICD-10-CM

## 2014-08-28 DIAGNOSIS — Z3402 Encounter for supervision of normal first pregnancy, second trimester: Secondary | ICD-10-CM

## 2014-08-28 DIAGNOSIS — Z3400 Encounter for supervision of normal first pregnancy, unspecified trimester: Secondary | ICD-10-CM

## 2014-08-28 DIAGNOSIS — Z1389 Encounter for screening for other disorder: Secondary | ICD-10-CM

## 2014-08-28 DIAGNOSIS — Z331 Pregnant state, incidental: Secondary | ICD-10-CM

## 2014-08-28 DIAGNOSIS — O4402 Placenta previa specified as without hemorrhage, second trimester: Secondary | ICD-10-CM

## 2014-08-28 LAB — POCT URINALYSIS DIPSTICK
Blood, UA: NEGATIVE
Glucose, UA: NEGATIVE
Ketones, UA: NEGATIVE
Nitrite, UA: NEGATIVE
Protein, UA: NEGATIVE

## 2014-08-28 NOTE — Patient Instructions (Signed)
You will have your sugar test next visit.  Please do not eat or drink anything after midnight the night before you come, not even water.  You will be here for at least two hours.     Call the office ([phone]) or go to [facility] if:  You begin to have strong, frequent contractions  Your water breaks.  Sometimes it is a big gush of fluid, sometimes it is just a trickle that keeps getting your panties wet or running down your legs  You have vaginal bleeding.  It is normal to have a small amount of spotting if your cervix was checked.  You don't feel your baby moving like normal.  If you don't, get you something to eat and drink and lay down and focus on feeling your baby move.    Second Trimester of Pregnancy The second trimester is from week 13 through week 28, months 4 through 6. The second trimester is often a time when you feel your best. Your body has also adjusted to being pregnant, and you begin to feel better physically. Usually, morning sickness has lessened or quit completely, you may have more energy, and you may have an increase in appetite. The second trimester is also a time when the fetus is growing rapidly. At the end of the sixth month, the fetus is about 9 inches long and weighs about 1 pounds. You will likely begin to feel the baby move (quickening) between 18 and 20 weeks of the pregnancy. BODY CHANGES Your body goes through many changes during pregnancy. The changes vary from woman to woman.   Your weight will continue to increase. You will notice your lower abdomen bulging out.  You may begin to get stretch marks on your hips, abdomen, and breasts.  You may develop headaches that can be relieved by medicines approved by your health care provider.  You may urinate more often because the fetus is pressing on your bladder.  You may develop or continue to have heartburn as a result of your pregnancy.  You may develop constipation because certain hormones are causing  the muscles that push waste through your intestines to slow down.  You may develop hemorrhoids or swollen, bulging veins (varicose veins).  You may have back pain because of the weight gain and pregnancy hormones relaxing your joints between the bones in your pelvis and as a result of a shift in weight and the muscles that support your balance.  Your breasts will continue to grow and be tender.  Your gums may bleed and may be sensitive to brushing and flossing.  Dark spots or blotches (chloasma, mask of pregnancy) may develop on your face. This will likely fade after the baby is born.  A dark line from your belly button to the pubic area (linea nigra) may appear. This will likely fade after the baby is born.  You may have changes in your hair. These can include thickening of your hair, rapid growth, and changes in texture. Some women also have hair loss during or after pregnancy, or hair that feels dry or thin. Your hair will most likely return to normal after your baby is born. WHAT TO EXPECT AT YOUR PRENATAL VISITS During a routine prenatal visit:  You will be weighed to make sure you and the fetus are growing normally.  Your blood pressure will be taken.  Your abdomen will be measured to track your baby's growth.  The fetal heartbeat will be listened to.  Any   test results from the previous visit will be discussed. Your health care provider may ask you:  How you are feeling.  If you are feeling the baby move.  If you have had any abnormal symptoms, such as leaking fluid, bleeding, severe headaches, or abdominal cramping.  If you have any questions. Other tests that may be performed during your second trimester include:  Blood tests that check for:  Low iron levels (anemia).  Gestational diabetes (between 24 and 28 weeks).  Rh antibodies.  Urine tests to check for infections, diabetes, or protein in the urine.  An ultrasound to confirm the proper growth and  development of the baby.  An amniocentesis to check for possible genetic problems.  Fetal screens for spina bifida and Down syndrome. HOME CARE INSTRUCTIONS   Avoid all smoking, herbs, alcohol, and unprescribed drugs. These chemicals affect the formation and growth of the baby.  Follow your health care provider's instructions regarding medicine use. There are medicines that are either safe or unsafe to take during pregnancy.  Exercise only as directed by your health care provider. Experiencing uterine cramps is a good sign to stop exercising.  Continue to eat regular, healthy meals.  Wear a good support bra for breast tenderness.  Do not use hot tubs, steam rooms, or saunas.  Wear your seat belt at all times when driving.  Avoid raw meat, uncooked cheese, cat litter boxes, and soil used by cats. These carry germs that can cause birth defects in the baby.  Take your prenatal vitamins.  Try taking a stool softener (if your health care provider approves) if you develop constipation. Eat more high-fiber foods, such as fresh vegetables or fruit and whole grains. Drink plenty of fluids to keep your urine clear or pale yellow.  Take warm sitz baths to soothe any pain or discomfort caused by hemorrhoids. Use hemorrhoid cream if your health care provider approves.  If you develop varicose veins, wear support hose. Elevate your feet for 15 minutes, 3-4 times a day. Limit salt in your diet.  Avoid heavy lifting, wear low heel shoes, and practice good posture.  Rest with your legs elevated if you have leg cramps or low back pain.  Visit your dentist if you have not gone yet during your pregnancy. Use a soft toothbrush to brush your teeth and be gentle when you floss.  A sexual relationship may be continued unless your health care provider directs you otherwise.  Continue to go to all your prenatal visits as directed by your health care provider. SEEK MEDICAL CARE IF:   You have  dizziness.  You have mild pelvic cramps, pelvic pressure, or nagging pain in the abdominal area.  You have persistent nausea, vomiting, or diarrhea.  You have a bad smelling vaginal discharge.  You have pain with urination. SEEK IMMEDIATE MEDICAL CARE IF:   You have a fever.  You are leaking fluid from your vagina.  You have spotting or bleeding from your vagina.  You have severe abdominal cramping or pain.  You have rapid weight gain or loss.  You have shortness of breath with chest pain.  You notice sudden or extreme swelling of your face, hands, ankles, feet, or legs.  You have not felt your baby move in over an hour.  You have severe headaches that do not go away with medicine.  You have vision changes. Document Released: 11/23/2001 Document Revised: 12/04/2013 Document Reviewed: 01/30/2013 Exit[employer] Patient Information 2015 [employer]. This information is not intended   to replace advice given to you by your health care provider. Make sure you discuss any questions you have with your health care provider.  

## 2014-08-28 NOTE — Progress Notes (Signed)
Low-risk OB appointment G1P0 [redacted]w[redacted]d Estimated Date of Delivery: 12/24/14 BP 110/64  Wt 200 lb (90.719 kg)  BP, weight, and urine reviewed.  Refer to obstetrical flow sheet for FH & FHR.  Reports good fm.  Denies regular uc's, lof, vb, or uti s/s. Some RLP.  Reviewed ptl s/s, fm. Plan:  Continue routine obstetrical care  F/U in 4wks for OB appointment, pn2, and f/u u/s to assess low-lying placenta

## 2014-09-03 ENCOUNTER — Ambulatory Visit (INDEPENDENT_AMBULATORY_CARE_PROVIDER_SITE_OTHER): Payer: 59 | Admitting: Obstetrics and Gynecology

## 2014-09-03 ENCOUNTER — Encounter: Payer: Self-pay | Admitting: Obstetrics and Gynecology

## 2014-09-03 ENCOUNTER — Telehealth: Payer: Self-pay | Admitting: *Deleted

## 2014-09-03 VITALS — BP 118/74 | Wt 203.0 lb

## 2014-09-03 DIAGNOSIS — Z3400 Encounter for supervision of normal first pregnancy, unspecified trimester: Secondary | ICD-10-CM

## 2014-09-03 DIAGNOSIS — Z3402 Encounter for supervision of normal first pregnancy, second trimester: Secondary | ICD-10-CM

## 2014-09-03 DIAGNOSIS — Z331 Pregnant state, incidental: Secondary | ICD-10-CM

## 2014-09-03 DIAGNOSIS — Z1389 Encounter for screening for other disorder: Secondary | ICD-10-CM

## 2014-09-03 LAB — POCT URINALYSIS DIPSTICK
Glucose, UA: NEGATIVE
KETONES UA: NEGATIVE
LEUKOCYTES UA: NEGATIVE
Nitrite, UA: NEGATIVE
Protein, UA: NEGATIVE
RBC UA: NEGATIVE

## 2014-09-03 NOTE — Telephone Encounter (Signed)
Pt states she feels like her left labia is swollen, it does not appear red or swollen but feels swollen and is irritated X 2 - 3 days.  Pt is concerned that it might be varicose veins.  Advised pt it would be best to have someone examine her and call transferred to front staff for appointment to be made.  She is also wanting to get a note that states it is OK for her to do pregnancy palates at [facility], informed her that she could also address that at time of visit.

## 2014-09-03 NOTE — Progress Notes (Signed)
  [redacted]w[redacted]d.G1P0 Vulvar swelling , exam notable only for varicosities. Pt reassured Classes for [facility] encouraged for pt and partner

## 2014-09-03 NOTE — Progress Notes (Signed)
Pt states that she has a heaviness on her left labia/swelling. Pt states that she has noticed it for about 5 days.

## 2014-09-20 ENCOUNTER — Telehealth: Payer: Self-pay | Admitting: Obstetrics & Gynecology

## 2014-09-20 NOTE — Telephone Encounter (Signed)
Spoke with pt. Pt works in [place] at [employer]. Pt had a pt last pm that may be in an active state of shingles. Our pt helped the pt out of the chair, but didn't US her after finding out this info. I spoke with Dr. Emelda FearFerguson. He advised it requires direct contact to a moist lesion. The chance of her getting it is slim. I gave this info to pt.  Pt voiced understanding. JSY

## 2014-09-26 ENCOUNTER — Ambulatory Visit (INDEPENDENT_AMBULATORY_CARE_PROVIDER_SITE_OTHER): Payer: 59

## 2014-09-26 ENCOUNTER — Ambulatory Visit (INDEPENDENT_AMBULATORY_CARE_PROVIDER_SITE_OTHER): Payer: 59 | Admitting: Advanced Practice Midwife

## 2014-09-26 ENCOUNTER — Encounter: Payer: Self-pay | Admitting: Advanced Practice Midwife

## 2014-09-26 ENCOUNTER — Other Ambulatory Visit: Payer: 59

## 2014-09-26 VITALS — BP 120/80 | Wt 207.2 lb

## 2014-09-26 DIAGNOSIS — Z131 Encounter for screening for diabetes mellitus: Secondary | ICD-10-CM

## 2014-09-26 DIAGNOSIS — Z3402 Encounter for supervision of normal first pregnancy, second trimester: Secondary | ICD-10-CM

## 2014-09-26 DIAGNOSIS — Z331 Pregnant state, incidental: Secondary | ICD-10-CM

## 2014-09-26 DIAGNOSIS — Z113 Encounter for screening for infections with a predominantly sexual mode of transmission: Secondary | ICD-10-CM

## 2014-09-26 DIAGNOSIS — Z1389 Encounter for screening for other disorder: Secondary | ICD-10-CM

## 2014-09-26 DIAGNOSIS — O4403 Placenta previa specified as without hemorrhage, third trimester: Secondary | ICD-10-CM

## 2014-09-26 DIAGNOSIS — Z114 Encounter for screening for human immunodeficiency virus [HIV]: Secondary | ICD-10-CM

## 2014-09-26 DIAGNOSIS — Z0184 Encounter for antibody response examination: Secondary | ICD-10-CM

## 2014-09-26 DIAGNOSIS — O4402 Placenta previa specified as without hemorrhage, second trimester: Secondary | ICD-10-CM

## 2014-09-26 LAB — POCT URINALYSIS DIPSTICK
GLUCOSE UA: NEGATIVE
Ketones, UA: NEGATIVE
Leukocytes, UA: NEGATIVE
Nitrite, UA: NEGATIVE
Protein, UA: NEGATIVE
RBC UA: NEGATIVE

## 2014-09-26 LAB — CBC
HEMATOCRIT: 37 % (ref 36.0–46.0)
Hemoglobin: 12.7 g/dL (ref 12.0–15.0)
MCH: 28.2 pg (ref 26.0–34.0)
MCHC: 34.3 g/dL (ref 30.0–36.0)
MCV: 82 fL (ref 78.0–100.0)
Platelets: 223 10*3/uL (ref 150–400)
RBC: 4.51 MIL/uL (ref 3.87–5.11)
RDW: 13.5 % (ref 11.5–15.5)
WBC: 13.1 10*3/uL — ABNORMAL HIGH (ref 4.0–10.5)

## 2014-09-26 NOTE — Progress Notes (Signed)
G1P0 4781w2d Estimated Date of Delivery: 12/24/14  Blood pressure 120/80, weight 207 lb 3.2 oz (93.985 kg).   BP weight and urine results all reviewed and noted.  Please refer to the obstetrical flow sheet for the fundal height and fetal heart rate documentation: Had us today to recheck low lying placenta:  Now resolved, anterior.  Doing PN2 today  Patient reports good fetal movement, denies any bleeding and no rupture of membranes symptoms or regular contractions. Patient is without complaints. All questions were answered.  Plan:  Continued routine obstetrical care,   Follow up in 4 weeks for OB appointment,

## 2014-09-26 NOTE — Progress Notes (Signed)
U/S(27+2wks)-breech active fetus, FHR-138 bpm, anterior Gr 1 placenta (previa resolved), appropriate growth EFW 2 lb 6 oz (53rd%tile),female fetus, cx long and closed (3.6cm)

## 2014-09-27 LAB — ANTIBODY SCREEN: ANTIBODY SCREEN: NEGATIVE

## 2014-09-27 LAB — GLUCOSE TOLERANCE, 2 HOURS W/ 1HR
GLUCOSE, 2 HOUR: 110 mg/dL (ref 70–139)
Glucose, 1 hour: 111 mg/dL (ref 70–170)
Glucose, Fasting: 72 mg/dL (ref 70–99)

## 2014-09-27 LAB — HSV 2 ANTIBODY, IGG

## 2014-09-27 LAB — HIV ANTIBODY (ROUTINE TESTING W REFLEX): HIV 1&2 Ab, 4th Generation: NONREACTIVE

## 2014-09-27 LAB — RPR

## 2014-09-30 ENCOUNTER — Telehealth: Payer: Self-pay | Admitting: Women's Health

## 2014-10-01 NOTE — Telephone Encounter (Signed)
Pt informed of normal lab results

## 2014-10-02 NOTE — Telephone Encounter (Signed)
Pt informed of normal lab results

## 2014-10-08 ENCOUNTER — Inpatient Hospital Stay (HOSPITAL_COMMUNITY): Admission: AD | Admit: 2014-10-08 | Payer: 59 | Source: Ambulatory Visit | Admitting: Obstetrics and Gynecology

## 2014-10-14 ENCOUNTER — Encounter: Payer: Self-pay | Admitting: Advanced Practice Midwife

## 2014-10-29 ENCOUNTER — Encounter: Payer: Self-pay | Admitting: Women's Health

## 2014-10-29 ENCOUNTER — Ambulatory Visit (INDEPENDENT_AMBULATORY_CARE_PROVIDER_SITE_OTHER): Payer: 59 | Admitting: Women's Health

## 2014-10-29 VITALS — BP 124/70 | Wt 216.0 lb

## 2014-10-29 DIAGNOSIS — Z331 Pregnant state, incidental: Secondary | ICD-10-CM

## 2014-10-29 DIAGNOSIS — Z3403 Encounter for supervision of normal first pregnancy, third trimester: Secondary | ICD-10-CM

## 2014-10-29 DIAGNOSIS — Z1389 Encounter for screening for other disorder: Secondary | ICD-10-CM

## 2014-10-29 LAB — POCT URINALYSIS DIPSTICK
Blood, UA: NEGATIVE
GLUCOSE UA: NEGATIVE
Ketones, UA: NEGATIVE
NITRITE UA: NEGATIVE
Protein, UA: NEGATIVE

## 2014-10-29 NOTE — Patient Instructions (Signed)
Tdap Vaccine  It is recommended that you get the Tdap vaccine during the third trimester of EACH pregnancy to help protect your baby from getting pertussis (whooping cough)  27-36 weeks is the BEST time to do this so that you can pass the protection on to your baby. During pregnancy is better than after pregnancy, but if you are unable to get it during pregnancy it will be offered at the hospital.   You can get this vaccine at the health department or your family doctor  Everyone who will be around your baby should also be up-to-date on their vaccines. Adults (who are not pregnant) only need 1 dose of Tdap during adulthood.    Call the office ([phone]) or go to [facility] if:  You begin to have strong, frequent contractions  Your water breaks.  Sometimes it is a big gush of fluid, sometimes it is just a trickle that keeps getting your panties wet or running down your legs  You have vaginal bleeding.  It is normal to have a small amount of spotting if your cervix was checked.   You don't feel your baby moving like normal.  If you don't, get you something to eat and drink and lay down and focus on feeling your baby move.  You should feel at least 10 movements in 2 hours.  If you don't, you should call the office or go to [facility].    Third Trimester of Pregnancy The third trimester is from week 29 through week 42, months 7 through 9. The third trimester is a time when the fetus is growing rapidly. At the end of the ninth month, the fetus is about 20 inches in length and weighs 6-10 pounds.  BODY CHANGES Your body goes through many changes during pregnancy. The changes vary from woman to woman.   Your weight will continue to increase. You can expect to gain 25-35 pounds (11-16 kg) by the end of the pregnancy.  You may begin to get stretch marks on your hips, abdomen, and breasts.  You may urinate more often because the fetus is moving lower into your pelvis and pressing on  your bladder.  You may develop or continue to have heartburn as a result of your pregnancy.  You may develop constipation because certain hormones are causing the muscles that push waste through your intestines to slow down.  You may develop hemorrhoids or swollen, bulging veins (varicose veins).  You may have pelvic pain because of the weight gain and pregnancy hormones relaxing your joints between the bones in your pelvis. Backaches may result from overexertion of the muscles supporting your posture.  You may have changes in your hair. These can include thickening of your hair, rapid growth, and changes in texture. Some women also have hair loss during or after pregnancy, or hair that feels dry or thin. Your hair will most likely return to normal after your baby is born.  Your breasts will continue to grow and be tender. A yellow discharge may leak from your breasts called colostrum.  Your belly button may stick out.  You may feel short of breath because of your expanding uterus.  You may notice the fetus "dropping," or moving lower in your abdomen.  You may have a bloody mucus discharge. This usually occurs a few days to a week before labor begins.  Your cervix becomes thin and soft (effaced) near your due date. WHAT TO EXPECT AT YOUR PRENATAL EXAMS  You will have   prenatal exams every 2 weeks until week 36. Then, you will have weekly prenatal exams. During a routine prenatal visit:  You will be weighed to make sure you and the fetus are growing normally.  Your blood pressure is taken.  Your abdomen will be measured to track your baby's growth.  The fetal heartbeat will be listened to.  Any test results from the previous visit will be discussed.  You may have a cervical check near your due date to see if you have effaced. At around 36 weeks, your caregiver will check your cervix. At the same time, your caregiver will also perform a test on the secretions of the vaginal tissue.  This test is to determine if a type of bacteria, Group B streptococcus, is present. Your caregiver will explain this further. Your caregiver may ask you:  What your birth plan is.  How you are feeling.  If you are feeling the baby move.  If you have had any abnormal symptoms, such as leaking fluid, bleeding, severe headaches, or abdominal cramping.  If you have any questions. Other tests or screenings that may be performed during your third trimester include:  Blood tests that check for low iron levels (anemia).  Fetal testing to check the health, activity level, and growth of the fetus. Testing is done if you have certain medical conditions or if there are problems during the pregnancy. FALSE LABOR You may feel small, irregular contractions that eventually go away. These are called Braxton Hicks contractions, or false labor. Contractions may last for hours, days, or even weeks before true labor sets in. If contractions come at regular intervals, intensify, or become painful, it is best to be seen by your caregiver.  SIGNS OF LABOR   Menstrual-like cramps.  Contractions that are 5 minutes apart or less.  Contractions that start on the top of the uterus and spread down to the lower abdomen and back.  A sense of increased pelvic pressure or back pain.  A watery or bloody mucus discharge that comes from the vagina. If you have any of these signs before the 37th week of pregnancy, call your caregiver right away. You need to go to the hospital to get checked immediately. HOME CARE INSTRUCTIONS   Avoid all smoking, herbs, alcohol, and unprescribed drugs. These chemicals affect the formation and growth of the baby.  Follow your caregiver's instructions regarding medicine use. There are medicines that are either safe or unsafe to take during pregnancy.  Exercise only as directed by your caregiver. Experiencing uterine cramps is a good sign to stop exercising.  Continue to eat regular,  healthy meals.  Wear a good support bra for breast tenderness.  Do not use hot tubs, steam rooms, or saunas.  Wear your seat belt at all times when driving.  Avoid raw meat, uncooked cheese, cat litter boxes, and soil used by cats. These carry germs that can cause birth defects in the baby.  Take your prenatal vitamins.  Try taking a stool softener (if your caregiver approves) if you develop constipation. Eat more high-fiber foods, such as fresh vegetables or fruit and whole grains. Drink plenty of fluids to keep your urine clear or pale yellow.  Take warm sitz baths to soothe any pain or discomfort caused by hemorrhoids. Use hemorrhoid cream if your caregiver approves.  If you develop varicose veins, wear support hose. Elevate your feet for 15 minutes, 3-4 times a day. Limit salt in your diet.  Avoid heavy lifting, wear low   heal shoes, and practice good posture.  Rest a lot with your legs elevated if you have leg cramps or low back pain.  Visit your dentist if you have not gone during your pregnancy. Use a soft toothbrush to brush your teeth and be gentle when you floss.  A sexual relationship may be continued unless your caregiver directs you otherwise.  Do not travel far distances unless it is absolutely necessary and only with the approval of your caregiver.  Take prenatal classes to understand, practice, and ask questions about the labor and delivery.  Make a trial run to the hospital.  Pack your hospital bag.  Prepare the baby's nursery.  Continue to go to all your prenatal visits as directed by your caregiver. SEEK MEDICAL CARE IF:  You are unsure if you are in labor or if your water has broken.  You have dizziness.  You have mild pelvic cramps, pelvic pressure, or nagging pain in your abdominal area.  You have persistent nausea, vomiting, or diarrhea.  You have a bad smelling vaginal discharge.  You have pain with urination. SEEK IMMEDIATE MEDICAL CARE IF:    You have a fever.  You are leaking fluid from your vagina.  You have spotting or bleeding from your vagina.  You have severe abdominal cramping or pain.  You have rapid weight loss or gain.  You have shortness of breath with chest pain.  You notice sudden or extreme swelling of your face, hands, ankles, feet, or legs.  You have not felt your baby move in over an hour.  You have severe headaches that do not go away with medicine.  You have vision changes. Document Released: 11/23/2001 Document Revised: 12/04/2013 Document Reviewed: 01/30/2013 Exit[employer] Patient Information 2015 [employer]. This information is not intended to replace advice given to you by your health care provider. Make sure you discuss any questions you have with your health care provider.  

## 2014-10-29 NOTE — Progress Notes (Signed)
Low-risk OB appointment G1P0 3826w0d Estimated Date of Delivery: 12/24/14 BP 124/70 mmHg  Wt 216 lb (97.977 kg)  BP, weight, and urine reviewed.  Refer to obstetrical flow sheet for FH & FHR.  Reports good fm.  Denies regular uc's, lof, vb, or uti s/s. No complaints. Reviewed pn2 results, ptl s/s, fkc. Plan:  Continue routine obstetrical care  F/U in 2wks for OB appointment

## 2014-11-13 ENCOUNTER — Encounter: Payer: Self-pay | Admitting: Advanced Practice Midwife

## 2014-11-13 ENCOUNTER — Ambulatory Visit (INDEPENDENT_AMBULATORY_CARE_PROVIDER_SITE_OTHER): Payer: 59 | Admitting: Advanced Practice Midwife

## 2014-11-13 VITALS — BP 120/90 | Wt 221.0 lb

## 2014-11-13 DIAGNOSIS — Z3403 Encounter for supervision of normal first pregnancy, third trimester: Secondary | ICD-10-CM

## 2014-11-13 DIAGNOSIS — Z1389 Encounter for screening for other disorder: Secondary | ICD-10-CM

## 2014-11-13 DIAGNOSIS — Z331 Pregnant state, incidental: Secondary | ICD-10-CM

## 2014-11-13 LAB — POCT URINALYSIS DIPSTICK
Blood, UA: NEGATIVE
Glucose, UA: NEGATIVE
Ketones, UA: NEGATIVE
NITRITE UA: NEGATIVE
PROTEIN UA: NEGATIVE

## 2014-11-13 NOTE — Progress Notes (Signed)
G1P0 3285w1d Estimated Date of Delivery: 12/24/14  Blood pressure 120/90, weight 221 lb (100.245 kg).   BP weight and urine results all reviewed and noted.  Please refer to the obstetrical flow sheet for the fundal height and fetal heart rate documentation:  Patient reports good fetal movement, denies any bleeding and no rupture of membranes symptoms or regular contractions. Patient is without complaints other than normal pregnancy complaints All questions were answered.  Plan:  Continued routine obstetrical care,   Follow up in 2 weeks for OB appointment,

## 2014-11-15 ENCOUNTER — Telehealth: Payer: Self-pay | Admitting: Obstetrics & Gynecology

## 2014-11-15 DIAGNOSIS — Z029 Encounter for administrative examinations, unspecified: Secondary | ICD-10-CM

## 2014-11-15 NOTE — Telephone Encounter (Signed)
Pt requesting 12 weeks FMLA and forms faxed. Pt informed will need to pay the $29.00 form fee and will have forms completed and faxed by next week. Call transferred to front staff for form pymt. Forms completed.

## 2014-11-26 ENCOUNTER — Ambulatory Visit (INDEPENDENT_AMBULATORY_CARE_PROVIDER_SITE_OTHER): Payer: 59 | Admitting: Women's Health

## 2014-11-26 ENCOUNTER — Encounter: Payer: Self-pay | Admitting: Women's Health

## 2014-11-26 VITALS — BP 122/68 | Wt 225.0 lb

## 2014-11-26 DIAGNOSIS — Z1389 Encounter for screening for other disorder: Secondary | ICD-10-CM

## 2014-11-26 DIAGNOSIS — Z3403 Encounter for supervision of normal first pregnancy, third trimester: Secondary | ICD-10-CM

## 2014-11-26 DIAGNOSIS — Z331 Pregnant state, incidental: Secondary | ICD-10-CM

## 2014-11-26 LAB — POCT URINALYSIS DIPSTICK
Blood, UA: NEGATIVE
Glucose, UA: NEGATIVE
Ketones, UA: NEGATIVE
NITRITE UA: NEGATIVE
Protein, UA: NEGATIVE

## 2014-11-26 NOTE — Patient Instructions (Signed)
Call the office ([phone]) or go to [facility] if:  You begin to have strong, frequent contractions  Your water breaks.  Sometimes it is a big gush of fluid, sometimes it is just a trickle that keeps getting your panties wet or running down your legs  You have vaginal bleeding.  It is normal to have a small amount of spotting if your cervix was checked.   You don't feel your baby moving like normal.  If you don't, get you something to eat and drink and lay down and focus on feeling your baby move.  You should feel at least 10 movements in 2 hours.  If you don't, you should call the office or go to [facility].    Preterm Labor Information Preterm labor is when labor starts at less than 37 weeks of pregnancy. The normal length of a pregnancy is 39 to 41 weeks. CAUSES Often, there is no identifiable underlying cause as to why a woman goes into preterm labor. One of the most common known causes of preterm labor is infection. Infections of the uterus, cervix, vagina, amniotic sac, bladder, kidney, or even the lungs (pneumonia) can cause labor to start. Other suspected causes of preterm labor include:   Urogenital infections, such as yeast infections and bacterial vaginosis.   Uterine abnormalities (uterine shape, uterine septum, fibroids, or bleeding from the placenta).   A cervix that has been operated on (it may fail to stay closed).   Malformations in the fetus.   Multiple gestations (twins, triplets, and so on).   Breakage of the amniotic sac.  RISK FACTORS  Having a previous history of preterm labor.   Having premature rupture of membranes (PROM).   Having a placenta that covers the opening of the cervix (placenta previa).   Having a placenta that separates from the uterus (placental abruption).   Having a cervix that is too weak to hold the fetus in the uterus (incompetent cervix).   Having too much fluid in the amniotic sac (polyhydramnios).   Taking  illegal drugs or smoking while pregnant.   Not gaining enough weight while pregnant.   Being younger than 18 and older than 26 years old.   Having a low socioeconomic status.   Being African American. SYMPTOMS Signs and symptoms of preterm labor include:   Menstrual-like cramps, abdominal pain, or back pain.  Uterine contractions that are regular, as frequent as six in an hour, regardless of their intensity (may be mild or painful).  Contractions that start on the top of the uterus and spread down to the lower abdomen and back.   A sense of increased pelvic pressure.   A watery or bloody mucus discharge that comes from the vagina.  TREATMENT Depending on the length of the pregnancy and other circumstances, your health care provider may suggest bed rest. If necessary, there are medicines that can be given to stop contractions and to mature the fetal lungs. If labor happens before 34 weeks of pregnancy, a prolonged hospital stay may be recommended. Treatment depends on the condition of both you and the fetus.  WHAT SHOULD YOU DO IF YOU THINK YOU ARE IN PRETERM LABOR? Call your health care provider right away. You will need to go to the hospital to get checked immediately. HOW CAN YOU PREVENT PRETERM LABOR IN FUTURE PREGNANCIES? You should:   Stop smoking if you smoke.  Maintain healthy weight gain and avoid chemicals and drugs that are not necessary.  Be watchful for   any type of infection.  Inform your health care provider if you have a known history of preterm labor. Document Released: 02/19/2004 Document Revised: 08/01/2013 Document Reviewed: 01/01/2013 ExitCare Patient Information 2015 [employer]. This information is not intended to replace advice given to you by your health care provider. Make sure you discuss any questions you have with your health care provider.  

## 2014-11-26 NOTE — Progress Notes (Signed)
Low-risk OB appointment G1P0 2534w0d Estimated Date of Delivery: 12/24/14 BP 122/68 mmHg  Wt 225 lb (102.059 kg)  BP, weight, and urine reviewed.  Refer to obstetrical flow sheet for FH & FHR.  Reports good fm.  Denies regular uc's, lof, vb, or uti s/s. 2 episodes of getting lightheaded, feeling like she's going to pass out, heart races- resolves w/in 10-15mins. Ate pop tart one morning and happened few hours later. Sounds like hypoglycemic episodes- discussed eating small frequent meals/snacks w/ protein.  SVE per request: LTC/high Reviewed ptl s/s, fkc. Plan:  Continue routine obstetrical care  F/U in 1wk for OB appointment and gbs

## 2014-12-04 ENCOUNTER — Encounter: Payer: Self-pay | Admitting: Women's Health

## 2014-12-04 ENCOUNTER — Ambulatory Visit (INDEPENDENT_AMBULATORY_CARE_PROVIDER_SITE_OTHER): Payer: 59 | Admitting: Women's Health

## 2014-12-04 VITALS — BP 106/66 | Wt 224.0 lb

## 2014-12-04 DIAGNOSIS — Z331 Pregnant state, incidental: Secondary | ICD-10-CM

## 2014-12-04 DIAGNOSIS — Z1389 Encounter for screening for other disorder: Secondary | ICD-10-CM

## 2014-12-04 DIAGNOSIS — Z1159 Encounter for screening for other viral diseases: Secondary | ICD-10-CM

## 2014-12-04 DIAGNOSIS — Z118 Encounter for screening for other infectious and parasitic diseases: Secondary | ICD-10-CM

## 2014-12-04 DIAGNOSIS — Z3403 Encounter for supervision of normal first pregnancy, third trimester: Secondary | ICD-10-CM

## 2014-12-04 DIAGNOSIS — Z3685 Encounter for antenatal screening for Streptococcus B: Secondary | ICD-10-CM

## 2014-12-04 LAB — POCT URINALYSIS DIPSTICK
Blood, UA: NEGATIVE
Glucose, UA: NEGATIVE
Ketones, UA: NEGATIVE
Leukocytes, UA: NEGATIVE
NITRITE UA: NEGATIVE
Protein, UA: NEGATIVE

## 2014-12-04 NOTE — Progress Notes (Signed)
Low-risk OB appointment G1P0 9788w1d Estimated Date of Delivery: 12/24/14 BP 106/66 mmHg  Wt 224 lb (101.606 kg)  BP, weight, and urine reviewed.  Refer to obstetrical flow sheet for FH & FHR.  Reports good fm.  Denies regular uc's, lof, vb, or uti s/s. Not sleeping well- reviewed relief measures. GBS collected SVE per request: outer os 1/inner os closed/thick, anterior, vtx Reviewed labor s/s, fkc. Plan:  Continue routine obstetrical care  F/U in 1wk for OB appointment

## 2014-12-04 NOTE — Patient Instructions (Signed)
Tips to Help You Sleep Better:   Get into a bedtime routine, try to do the same thing every night before going to bed to try to help your body wind down  Warm baths  Avoid caffeine for at least 3 hours before going to sleep   Keep your room at a slightly cooler temperature, can try running a fan  Turn off TV, lights, phone, electronics  Lots of pillows if needed to help you get comfortable  Lavender scented items can help you sleep. You can place lavender essential oil on a cotton ball and place under your pillowcase, or place in a diffuser. [employer] has a lavender scented sleep line (plug-ins, sprays, etc). Look in the pillow aisle for lavender scented pillows.   If none of the above things help, you can try 1/2 of a benadryl, unisom, or tylenol pm. Do not take this every night, only when you really need it.    Call the office [phone]) or [facility]:  You begin to have strong, frequent contractions  Your water breaks.  Sometimes it is a big gush of fluid, sometimes it is just a trickle that keeps getting your panties wet or running down your legs  You have vaginal bleeding.  It is normal to have a small amount of spotting if your cervix was checked.   You don't feel your baby moving like normal.  If you don't, get you something to eat and drink and lay down and focus on feeling your baby move.  You should feel at least 10 movements in 2 hours.  If you don't, you should call the office or [facility].    [facility] Contractions Contractions of the uterus can occur throughout pregnancy. Contractions are not always a sign that you are in labor.  WHAT ARE BRAXTON HICKS CONTRACTIONS?  Contractions that occur before labor are called Braxton Hicks contractions, or false labor. Toward the end of pregnancy (32-34 weeks), these contractions can develop more often and may become more forceful. This is not true labor because these contractions do not result in  opening (dilatation) and thinning of the cervix. They are sometimes difficult to tell apart from true labor because these contractions can be forceful and people have different pain tolerances. You should not feel embarrassed if you go to the hospital with false labor. Sometimes, the only way to tell if you are in true labor is for your health care provider to look for changes in the cervix. If there are no prenatal problems or other health problems associated with the pregnancy, it is completely safe to be sent home with false labor and await the onset of true labor. HOW CAN YOU TELL THE DIFFERENCE BETWEEN TRUE AND FALSE LABOR? False Labor 13. The contractions of false labor are usually shorter and not as hard as those of true labor.  14. The contractions are usually irregular.  15. The contractions are often felt in the front of the lower abdomen and in the groin.  16. The contractions may go away when you walk around or change positions while lying down.  17. The contractions get weaker and are shorter lasting as time goes on.  18. The contractions do not usually become progressively stronger, regular, and closer together as with true labor.  True Labor  Contractions in true labor last 30-70 seconds, become very regular, usually become more intense, and increase in frequency.   The contractions do not go away with  walking.   The discomfort is usually felt in the top of the uterus and spreads to the lower abdomen and low back.   True labor can be determined by your health care provider with an exam. This will show that the cervix is dilating and getting thinner.  WHAT TO REMEMBER  Keep up with your usual exercises and follow other instructions given by your health care provider.   Take medicines as directed by your health care provider.   Keep your regular prenatal appointments.   Eat and drink lightly if you think you are going into labor.   If Braxton Hicks contractions  are making you uncomfortable:   Change your position from lying down or resting to walking, or from walking to resting.   Sit and rest in a tub of warm water.   Drink 2-3 glasses of water. Dehydration may cause these contractions.   Do slow and deep breathing several times an hour.  WHEN SHOULD I SEEK IMMEDIATE MEDICAL CARE? Seek immediate medical care if:  Your contractions become stronger, more regular, and closer together.   You have fluid leaking or gushing from your vagina.   You have a fever.   You pass blood-tinged mucus.   You have vaginal bleeding.   You have continuous abdominal pain.   You have low back pain that you never had before.   You feel your baby's head pushing down and causing pelvic pressure.   Your baby is not moving as much as it used to.  Document Released: 11/29/2005 Document Revised: 12/04/2013 Document Reviewed: 09/10/2013 [facility] Patient Information 2015 [place], [place]. This information is not intended to replace advice given to you by your health care provider. Make sure you discuss any questions you have with your health care provider.

## 2014-12-05 LAB — GC/CHLAMYDIA PROBE AMP
CT Probe RNA: NEGATIVE
GC Probe RNA: NEGATIVE

## 2014-12-05 LAB — STREP B DNA PROBE: GBSP: NOT DETECTED

## 2014-12-10 ENCOUNTER — Ambulatory Visit (INDEPENDENT_AMBULATORY_CARE_PROVIDER_SITE_OTHER): Payer: 59 | Admitting: Advanced Practice Midwife

## 2014-12-10 VITALS — BP 124/88 | Wt 227.0 lb

## 2014-12-10 DIAGNOSIS — Z3403 Encounter for supervision of normal first pregnancy, third trimester: Secondary | ICD-10-CM

## 2014-12-10 DIAGNOSIS — Z331 Pregnant state, incidental: Secondary | ICD-10-CM

## 2014-12-10 DIAGNOSIS — Z1389 Encounter for screening for other disorder: Secondary | ICD-10-CM

## 2014-12-10 LAB — POCT URINALYSIS DIPSTICK
Blood, UA: NEGATIVE
Glucose, UA: NEGATIVE
Ketones, UA: NEGATIVE
Leukocytes, UA: NEGATIVE
Nitrite, UA: NEGATIVE
Protein, UA: NEGATIVE

## 2014-12-10 NOTE — Progress Notes (Signed)
G1P0 797w0d Estimated Date of Delivery: 12/24/14  There were no vitals taken for this visit.   BP weight and urine results all reviewed and noted.  Please refer to the obstetrical flow sheet for the fundal height and fetal heart rate documentation:  Patient reports good fetal movement, denies any bleeding and no rupture of membranes symptoms or regular contractions. Patient is without complaints. All questions were answered.  Plan:  Continued routine obstetrical care,   Follow up in 1 weeks for OB appointment,

## 2014-12-11 ENCOUNTER — Telehealth: Payer: Self-pay | Admitting: Obstetrics & Gynecology

## 2014-12-11 NOTE — Telephone Encounter (Signed)
Spoke with pt. Pt is [redacted] weeks pregnant. She hasn't got her Tdap yet. Pt states she read that it's best to get before 36 weeks. I spoke with Dr. Despina HiddenEure and he advised it's still ok to get injection. Pt aware. JSY

## 2014-12-16 ENCOUNTER — Encounter: Payer: Self-pay | Admitting: Obstetrics and Gynecology

## 2014-12-16 ENCOUNTER — Ambulatory Visit (INDEPENDENT_AMBULATORY_CARE_PROVIDER_SITE_OTHER): Payer: 59 | Admitting: Obstetrics and Gynecology

## 2014-12-16 ENCOUNTER — Other Ambulatory Visit: Payer: Self-pay | Admitting: Obstetrics and Gynecology

## 2014-12-16 DIAGNOSIS — Z331 Pregnant state, incidental: Secondary | ICD-10-CM

## 2014-12-16 DIAGNOSIS — Z3403 Encounter for supervision of normal first pregnancy, third trimester: Secondary | ICD-10-CM

## 2014-12-16 DIAGNOSIS — Z1389 Encounter for screening for other disorder: Secondary | ICD-10-CM

## 2014-12-16 LAB — POCT URINALYSIS DIPSTICK
Blood, UA: NEGATIVE
Glucose, UA: NEGATIVE
Ketones, UA: NEGATIVE
LEUKOCYTES UA: NEGATIVE
NITRITE UA: NEGATIVE
PROTEIN UA: NEGATIVE

## 2014-12-16 NOTE — Progress Notes (Signed)
Patient ID: Kylie White, female   DOB: [birth year], 29 y.o.   MRN: [id number] G1P0 [redacted]w[redacted]d Estimated Date of Delivery: 12/24/14  Blood pressure 110/78, weight 228 lb (103.42 kg).   refer to the ob flow sheet for FH and FHR, also BP, Wt, Urine results: negative  Patient reports +good fetal movement, denies any bleeding and no rupture of membranes symptoms or regular contractions. Patient complaints: Pt states that she felt the baby flip with associated pelvic cramping two days ago.  She conducted her own ultrasound and found that the baby was breeched.  This is her first pregnancy.   FH - 40cm FHR - 147 U/s revealed footling breech and anterior placenta.  Questions were answered and external aversion explained in detail with patient Assessment: [redacted]w[redacted]d, G1P0,  Plan:  Continued routine obstetrical care,  F/u in 1 day for external version procedure.. Correction , patient declines ECV and requests scheduling of primary C/S. This chart was scribed for Tilda Burrow, MD by Carl Best, ED Scribe. This patient was seen in Room 1 and the patient's care was started at 10:32 AM.

## 2014-12-16 NOTE — Progress Notes (Signed)
Pt states that the baby is breech!!!

## 2014-12-16 NOTE — Patient Instructions (Signed)
Primary cesarean at 1 pm on Thursday scheduled

## 2014-12-16 NOTE — Progress Notes (Signed)
High Risk Pregnancy Diagnosis(es):   Persistent breech at 38 w 6d   G1P0 [redacted]w[redacted]d Estimated Date of Delivery: 12/24/14  Blood pressure 110/78, weight 228 lb (103.42 kg).  Urinalysis: Negative   HPI:see notes by Celina Holson below Pt being evaluated for EC Version , pt has good AF volume, and baby is oblique, with anterior placenta. And vertex seems mobile Pt has lots of questions re :ECV, risk/benefit, she's read extensively , and is a sonologist.  BP weight and urine results all reviewed and noted. Patient reports good fetal movement, denies any bleeding and no rupture of membranes symptoms or regular contractions.  Fundal Height:  40 Fetal Heart rate:  147 Edema:  None U/s by me confirms position and AF volume,  Discussed pro/con of ECV and responded to pts extensive questions. Pt declines ECV  Will schedule for cesarean section per pt request. Available time Thursday 1pm.  Patient is without complaints. All questions were answered.  Assessment:  [redacted]w[redacted]d, persistent breech, footling.  Medication(s) Plans:  none  Treatment Plan:  Cesarean scheduled for Thursday 1pm  Follow up in 2 weeks for post-op check

## 2014-12-18 ENCOUNTER — Encounter (HOSPITAL_COMMUNITY): Payer: Self-pay

## 2014-12-18 ENCOUNTER — Inpatient Hospital Stay (HOSPITAL_COMMUNITY): Payer: 59

## 2014-12-18 ENCOUNTER — Encounter: Payer: 59 | Admitting: Women's Health

## 2014-12-18 ENCOUNTER — Encounter (HOSPITAL_COMMUNITY)
Admission: RE | Admit: 2014-12-18 | Discharge: 2014-12-18 | Disposition: A | Discharge status: Home / Self Care | Payer: 59 | Source: Ambulatory Visit | Attending: Obstetrics and Gynecology | Admitting: Obstetrics and Gynecology

## 2014-12-18 HISTORY — DX: Gastro-esophageal reflux disease without esophagitis: K21.9

## 2014-12-18 HISTORY — DX: Headache: R51

## 2014-12-18 HISTORY — DX: Headache, unspecified: R51.9

## 2014-12-18 LAB — URINALYSIS, ROUTINE W REFLEX MICROSCOPIC
Bilirubin Urine: NEGATIVE
Glucose, UA: NEGATIVE mg/dL
HGB URINE DIPSTICK: NEGATIVE
KETONES UR: NEGATIVE mg/dL
Nitrite: NEGATIVE
Protein, ur: NEGATIVE mg/dL
Specific Gravity, Urine: 1.01 (ref 1.005–1.030)
Urobilinogen, UA: 0.2 mg/dL (ref 0.0–1.0)
pH: 6.5 (ref 5.0–8.0)

## 2014-12-18 LAB — CBC
HCT: 34.5 % — ABNORMAL LOW (ref 36.0–46.0)
HEMOGLOBIN: 11.5 g/dL — AB (ref 12.0–15.0)
MCH: 25.6 pg — AB (ref 26.0–34.0)
MCHC: 33.3 g/dL (ref 30.0–36.0)
MCV: 76.8 fL — AB (ref 78.0–100.0)
Platelets: 176 10*3/uL (ref 150–400)
RBC: 4.49 MIL/uL (ref 3.87–5.11)
RDW: 14.4 % (ref 11.5–15.5)
WBC: 13.7 10*3/uL — ABNORMAL HIGH (ref 4.0–10.5)

## 2014-12-18 LAB — TYPE AND SCREEN
ABO/RH(D): A POS
Antibody Screen: NEGATIVE

## 2014-12-18 LAB — RPR

## 2014-12-18 LAB — URINE MICROSCOPIC-ADD ON

## 2014-12-18 LAB — ABO/RH: ABO/RH(D): A POS

## 2014-12-18 NOTE — Patient Instructions (Signed)
Your procedure is scheduled on:  TOMORROW, December 19, 2014  Enter through the Main Entrance of [facility] at:  11:30 A.M.  Pick up the phone at the desk and dial [phone].  Call this number if you have problems the morning of surgery: [phone].  Remember: Do NOT eat food:  AFTER MIDNIGHT TONIGHT Do NOT drink clear liquids after:  AFTER 9:00 A.M. TOMORROW Take these medicines the morning of surgery with a SIP OF WATER:  NONE  Do NOT wear jewelry (body piercing), metal hair clips/bobby pins, or nail polish. Do NOT wear lotions, powders, or perfumes.  You may wear deoderant. Do NOT shave for 48 hours prior to surgery. Do NOT bring valuables to the hospital.  Leave suitcase in car.  After surgery it may be brought to your room.  For patients admitted to the hospital, checkout time is 11:00 AM the day of discharge.

## 2014-12-19 ENCOUNTER — Encounter (HOSPITAL_COMMUNITY): Payer: Self-pay | Admitting: Certified Registered"

## 2014-12-19 ENCOUNTER — Encounter (HOSPITAL_COMMUNITY)
Admission: RE | Disposition: A | Discharge status: Home / Self Care | Payer: Self-pay | Source: Ambulatory Visit | Attending: Obstetrics and Gynecology

## 2014-12-19 ENCOUNTER — Inpatient Hospital Stay (HOSPITAL_COMMUNITY): Payer: 59 | Admitting: Anesthesiology

## 2014-12-19 ENCOUNTER — Inpatient Hospital Stay (HOSPITAL_COMMUNITY)
Admission: RE | Admit: 2014-12-19 | Discharge: 2014-12-22 | DRG: 766 | Disposition: A | Discharge status: Home / Self Care | Payer: 59 | Source: Ambulatory Visit | Attending: Obstetrics and Gynecology | Admitting: Obstetrics and Gynecology

## 2014-12-19 DIAGNOSIS — O321XX0 Maternal care for breech presentation, not applicable or unspecified: Secondary | ICD-10-CM | POA: Diagnosis present

## 2014-12-19 DIAGNOSIS — O9952 Diseases of the respiratory system complicating childbirth: Secondary | ICD-10-CM | POA: Diagnosis present

## 2014-12-19 DIAGNOSIS — Z8249 Family history of ischemic heart disease and other diseases of the circulatory system: Secondary | ICD-10-CM | POA: Diagnosis not present

## 2014-12-19 DIAGNOSIS — K219 Gastro-esophageal reflux disease without esophagitis: Secondary | ICD-10-CM | POA: Diagnosis present

## 2014-12-19 DIAGNOSIS — O328XX0 Maternal care for other malpresentation of fetus, not applicable or unspecified: Secondary | ICD-10-CM | POA: Diagnosis present

## 2014-12-19 DIAGNOSIS — O9962 Diseases of the digestive system complicating childbirth: Secondary | ICD-10-CM | POA: Diagnosis present

## 2014-12-19 DIAGNOSIS — Z3A39 39 weeks gestation of pregnancy: Secondary | ICD-10-CM | POA: Diagnosis present

## 2014-12-19 DIAGNOSIS — J45990 Exercise induced bronchospasm: Secondary | ICD-10-CM | POA: Diagnosis present

## 2014-12-19 DIAGNOSIS — O328XX1 Maternal care for other malpresentation of fetus, fetus 1: Secondary | ICD-10-CM

## 2014-12-19 DIAGNOSIS — Z98891 History of uterine scar from previous surgery: Secondary | ICD-10-CM

## 2014-12-19 SURGERY — Surgical Case
Anesthesia: Spinal | Site: Abdomen

## 2014-12-19 MED ORDER — LANOLIN HYDROUS EX OINT: 1.0000 "application " | TOPICAL_OINTMENT | CUTANEOUS | Status: DC | PRN

## 2014-12-19 MED ORDER — OXYTOCIN 10 UNIT/ML IJ SOLN
INTRAMUSCULAR | Status: AC
  Filled 2014-12-19: qty 4

## 2014-12-19 MED ORDER — WITCH HAZEL-GLYCERIN EX PADS: 1.0000 "application " | MEDICATED_PAD | CUTANEOUS | Status: DC | PRN

## 2014-12-19 MED ORDER — KETOROLAC TROMETHAMINE 30 MG/ML IJ SOLN
30.0000 mg | Freq: Four times a day (QID) | INTRAMUSCULAR | Status: AC | PRN
  Administered 2014-12-19: 30 mg via INTRAVENOUS

## 2014-12-19 MED ORDER — SIMETHICONE 80 MG PO CHEW
80.0000 mg | CHEWABLE_TABLET | ORAL | Status: DC
  Administered 2014-12-19 – 2014-12-21 (×3): 80 mg via ORAL
  Filled 2014-12-19 (×3): qty 1

## 2014-12-19 MED ORDER — MENTHOL 3 MG MT LOZG
1.0000 | LOZENGE | OROMUCOSAL | Status: DC | PRN
Start: 2014-12-19 — End: 2014-12-22

## 2014-12-19 MED ORDER — ACETAMINOPHEN 325 MG PO TABS: 325.0000 mg | ORAL_TABLET | ORAL | Status: DC | PRN

## 2014-12-19 MED ORDER — ONDANSETRON HCL 4 MG/2ML IJ SOLN: 4.0000 mg | INTRAMUSCULAR | Status: DC | PRN

## 2014-12-19 MED ORDER — ONDANSETRON HCL 4 MG PO TABS: 4.0000 mg | ORAL_TABLET | ORAL | Status: DC | PRN

## 2014-12-19 MED ORDER — OXYCODONE-ACETAMINOPHEN 5-325 MG PO TABS
1.0000 | ORAL_TABLET | ORAL | Status: DC | PRN
  Administered 2014-12-20 – 2014-12-22 (×10): 1 via ORAL
  Filled 2014-12-19 (×10): qty 1

## 2014-12-19 MED ORDER — PHENYLEPHRINE 8 MG IN D5W 100 ML (0.08MG/ML) PREMIX OPTIME
INJECTION | INTRAVENOUS | Status: AC
  Filled 2014-12-19: qty 100

## 2014-12-19 MED ORDER — FENTANYL CITRATE 0.05 MG/ML IJ SOLN
INTRAMUSCULAR | Status: DC | PRN
  Administered 2014-12-19: 25 ug via INTRATHECAL

## 2014-12-19 MED ORDER — ONDANSETRON HCL 4 MG/2ML IJ SOLN
INTRAMUSCULAR | Status: AC
  Filled 2014-12-19: qty 2

## 2014-12-19 MED ORDER — LACTATED RINGERS IV SOLN
INTRAVENOUS | Status: DC | PRN
  Administered 2014-12-19: 14:00:00 via INTRAVENOUS

## 2014-12-19 MED ORDER — LACTATED RINGERS IV SOLN
INTRAVENOUS | Status: DC
  Administered 2014-12-20: 03:00:00 via INTRAVENOUS

## 2014-12-19 MED ORDER — SCOPOLAMINE 1 MG/3DAYS TD PT72
1.0000 | MEDICATED_PATCH | Freq: Once | TRANSDERMAL | Status: DC
  Administered 2014-12-19: 1.5 mg via TRANSDERMAL

## 2014-12-19 MED ORDER — TETANUS-DIPHTH-ACELL PERTUSSIS 5-2.5-18.5 LF-MCG/0.5 IM SUSP: 0.5000 mL | Freq: Once | INTRAMUSCULAR | Status: DC

## 2014-12-19 MED ORDER — ACETAMINOPHEN 160 MG/5ML PO SOLN: 325.0000 mg | ORAL | Status: DC | PRN

## 2014-12-19 MED ORDER — SCOPOLAMINE 1 MG/3DAYS TD PT72
MEDICATED_PATCH | TRANSDERMAL | Status: AC
  Administered 2014-12-19: 1.5 mg via TRANSDERMAL
  Filled 2014-12-19: qty 1

## 2014-12-19 MED ORDER — OXYTOCIN 10 UNIT/ML IJ SOLN
40.0000 [IU] | INTRAMUSCULAR | Status: DC | PRN
  Administered 2014-12-19: 40 [IU] via INTRAVENOUS

## 2014-12-19 MED ORDER — DIPHENHYDRAMINE HCL 25 MG PO CAPS: 25.0000 mg | ORAL_CAPSULE | Freq: Four times a day (QID) | ORAL | Status: DC | PRN

## 2014-12-19 MED ORDER — PRENATAL MULTIVITAMIN CH
1.0000 | ORAL_TABLET | Freq: Every day | ORAL | Status: DC
  Administered 2014-12-21 – 2014-12-22 (×2): 1 via ORAL
  Filled 2014-12-19 (×3): qty 1

## 2014-12-19 MED ORDER — SIMETHICONE 80 MG PO CHEW: 80.0000 mg | CHEWABLE_TABLET | ORAL | Status: DC | PRN

## 2014-12-19 MED ORDER — IBUPROFEN 600 MG PO TABS
600.0000 mg | ORAL_TABLET | Freq: Four times a day (QID) | ORAL | Status: DC
  Administered 2014-12-19 – 2014-12-22 (×11): 600 mg via ORAL
  Filled 2014-12-19 (×10): qty 1

## 2014-12-19 MED ORDER — OXYTOCIN 40 UNITS IN LACTATED RINGERS INFUSION - SIMPLE MED: 62.5000 mL/h | INTRAVENOUS | Status: AC

## 2014-12-19 MED ORDER — MORPHINE SULFATE 0.5 MG/ML IJ SOLN
INTRAMUSCULAR | Status: AC
  Filled 2014-12-19: qty 10

## 2014-12-19 MED ORDER — LACTATED RINGERS IV SOLN
Freq: Once | INTRAVENOUS | Status: AC
Start: 2014-12-19 — End: 2014-12-19
  Administered 2014-12-19: 12:00:00 via INTRAVENOUS

## 2014-12-19 MED ORDER — MEPERIDINE HCL 25 MG/ML IJ SOLN: 6.2500 mg | INTRAMUSCULAR | Status: DC | PRN

## 2014-12-19 MED ORDER — KETOROLAC TROMETHAMINE 30 MG/ML IJ SOLN
INTRAMUSCULAR | Status: AC
Start: 2014-12-19 — End: 2014-12-20
  Filled 2014-12-19: qty 1

## 2014-12-19 MED ORDER — FENTANYL CITRATE 0.05 MG/ML IJ SOLN
INTRAMUSCULAR | Status: AC
  Filled 2014-12-19: qty 2

## 2014-12-19 MED ORDER — DIBUCAINE 1 % RE OINT: 1.0000 "application " | TOPICAL_OINTMENT | RECTAL | Status: DC | PRN

## 2014-12-19 MED ORDER — LACTATED RINGERS IV SOLN
INTRAVENOUS | Status: DC
  Administered 2014-12-19 (×3): via INTRAVENOUS

## 2014-12-19 MED ORDER — SIMETHICONE 80 MG PO CHEW
80.0000 mg | CHEWABLE_TABLET | Freq: Three times a day (TID) | ORAL | Status: DC
  Administered 2014-12-19 – 2014-12-22 (×9): 80 mg via ORAL
  Filled 2014-12-19 (×9): qty 1

## 2014-12-19 MED ORDER — MIDAZOLAM HCL 2 MG/2ML IJ SOLN: 0.5000 mg | Freq: Once | INTRAMUSCULAR | Status: DC | PRN

## 2014-12-19 MED ORDER — MORPHINE SULFATE (PF) 0.5 MG/ML IJ SOLN
INTRAMUSCULAR | Status: DC | PRN
  Administered 2014-12-19: .125 mg via INTRATHECAL

## 2014-12-19 MED ORDER — OXYCODONE-ACETAMINOPHEN 5-325 MG PO TABS: 2.0000 | ORAL_TABLET | ORAL | Status: DC | PRN

## 2014-12-19 MED ORDER — KETOROLAC TROMETHAMINE 30 MG/ML IJ SOLN: 30.0000 mg | Freq: Once | INTRAMUSCULAR | Status: DC | PRN

## 2014-12-19 MED ORDER — FENTANYL CITRATE 0.05 MG/ML IJ SOLN: 25.0000 ug | INTRAMUSCULAR | Status: DC | PRN

## 2014-12-19 MED ORDER — KETOROLAC TROMETHAMINE 30 MG/ML IJ SOLN: 30.0000 mg | Freq: Four times a day (QID) | INTRAMUSCULAR | Status: DC | PRN

## 2014-12-19 MED ORDER — CEFAZOLIN SODIUM-DEXTROSE 2-3 GM-% IV SOLR
INTRAVENOUS | Status: AC
  Administered 2014-12-19: 2 g via INTRAVENOUS
  Filled 2014-12-19: qty 50

## 2014-12-19 MED ORDER — 0.9 % SODIUM CHLORIDE (POUR BTL) OPTIME
TOPICAL | Status: DC | PRN
  Administered 2014-12-19: 1000 mL

## 2014-12-19 MED ORDER — ONDANSETRON HCL 4 MG/2ML IJ SOLN
INTRAMUSCULAR | Status: DC | PRN
Start: 2014-12-19 — End: 2014-12-19
  Administered 2014-12-19: 4 mg via INTRAVENOUS

## 2014-12-19 MED ORDER — SENNOSIDES-DOCUSATE SODIUM 8.6-50 MG PO TABS
2.0000 | ORAL_TABLET | ORAL | Status: DC
  Administered 2014-12-19 – 2014-12-21 (×3): 2 via ORAL
  Filled 2014-12-19 (×3): qty 2

## 2014-12-19 MED ORDER — ZOLPIDEM TARTRATE 5 MG PO TABS: 5.0000 mg | ORAL_TABLET | Freq: Every evening | ORAL | Status: DC | PRN

## 2014-12-19 MED ORDER — PHENYLEPHRINE 8 MG IN D5W 100 ML (0.08MG/ML) PREMIX OPTIME
INJECTION | INTRAVENOUS | Status: DC | PRN
  Administered 2014-12-19: 60 ug/min via INTRAVENOUS

## 2014-12-19 MED ORDER — PROMETHAZINE HCL 25 MG/ML IJ SOLN: 6.2500 mg | INTRAMUSCULAR | Status: DC | PRN

## 2014-12-19 SURGICAL SUPPLY — 41 items
BENZOIN TINCTURE PRP APPL 2/3 (GAUZE/BANDAGES/DRESSINGS) ×3 IMPLANT
CLAMP CORD UMBIL (MISCELLANEOUS) IMPLANT
CLOSURE WOUND 1/2 X4 (GAUZE/BANDAGES/DRESSINGS) ×1
CLOTH BEACON ORANGE TIMEOUT ST (SAFETY) ×3 IMPLANT
DRAPE SHEET LG 3/4 BI-LAMINATE (DRAPES) IMPLANT
DRSG OPSITE POSTOP 4X10 (GAUZE/BANDAGES/DRESSINGS) ×3 IMPLANT
DURAPREP 26ML APPLICATOR (WOUND CARE) ×3 IMPLANT
ELECT REM PT RETURN 9FT ADLT (ELECTROSURGICAL) ×3
ELECTRODE REM PT RTRN 9FT ADLT (ELECTROSURGICAL) ×1 IMPLANT
EXTRACTOR VACUUM KIWI (MISCELLANEOUS) IMPLANT
GLOVE BIO SURGEON ST LM GN SZ9 (GLOVE) ×3 IMPLANT
GLOVE BIO SURGEON STRL SZ 6.5 (GLOVE) ×2 IMPLANT
GLOVE BIO SURGEONS STRL SZ 6.5 (GLOVE) ×1
GLOVE BIOGEL PI IND STRL 7.0 (GLOVE) ×3 IMPLANT
GLOVE BIOGEL PI IND STRL 7.5 (GLOVE) ×1 IMPLANT
GLOVE BIOGEL PI IND STRL 9 (GLOVE) ×1 IMPLANT
GLOVE BIOGEL PI INDICATOR 7.0 (GLOVE) ×6
GLOVE BIOGEL PI INDICATOR 7.5 (GLOVE) ×2
GLOVE BIOGEL PI INDICATOR 9 (GLOVE) ×2
GLOVE ECLIPSE 7.0 STRL STRAW (GLOVE) ×3 IMPLANT
GOWN STRL REUS W/TWL 2XL LVL3 (GOWN DISPOSABLE) ×3 IMPLANT
GOWN STRL REUS W/TWL LRG LVL3 (GOWN DISPOSABLE) ×3 IMPLANT
NEEDLE HYPO 25X5/8 SAFETYGLIDE (NEEDLE) IMPLANT
NS IRRIG 1000ML POUR BTL (IV SOLUTION) ×3 IMPLANT
PACK C SECTION WH (CUSTOM PROCEDURE TRAY) ×3 IMPLANT
PAD OB MATERNITY 4.3X12.25 (PERSONAL CARE ITEMS) ×3 IMPLANT
RTRCTR C-SECT PINK 25CM LRG (MISCELLANEOUS) ×3 IMPLANT
RTRCTR C-SECT PINK 34CM XLRG (MISCELLANEOUS) IMPLANT
STRIP CLOSURE SKIN 1/2X4 (GAUZE/BANDAGES/DRESSINGS) ×2 IMPLANT
SUT MNCRL 0 VIOLET CTX 36 (SUTURE) ×3 IMPLANT
SUT MONOCRYL 0 CTX 36 (SUTURE) ×6
SUT PLAIN 2 0 (SUTURE) ×2
SUT PLAIN ABS 2-0 CT1 27XMFL (SUTURE) ×1 IMPLANT
SUT VIC AB 0 CT1 27 (SUTURE) ×2
SUT VIC AB 0 CT1 27XBRD ANBCTR (SUTURE) ×1 IMPLANT
SUT VIC AB 2-0 CT1 27 (SUTURE) ×2
SUT VIC AB 2-0 CT1 TAPERPNT 27 (SUTURE) ×1 IMPLANT
SUT VIC AB 4-0 KS 27 (SUTURE) ×3 IMPLANT
SYR BULB IRRIGATION 50ML (SYRINGE) IMPLANT
TOWEL OR 17X24 6PK STRL BLUE (TOWEL DISPOSABLE) ×3 IMPLANT
TRAY FOLEY CATH 14FR (SET/KITS/TRAYS/PACK) ×3 IMPLANT

## 2014-12-19 NOTE — Op Note (Signed)
12/19/2014  2:38 PM  PATIENT:  Kylie White  29 y.o. female  PRE-OPERATIVE DIAGNOSIS:  PREGNANCY 39 WKS/BREECH, footling  POST-OPERATIVE DIAGNOSIS:  PREGNANCY 39 WKS/BREECH, footling, delivered  PROCEDURE:  Procedure(s): PRIMARY CESAREAN SECTION (N/A) low transverse cervical  SURGEON:  Surgeon(s) and Role:    * Tilda BurrowJohn Daneesha Quinteros V, MD - Primary  PHYSICIAN ASSISTANT: Loreta AveAcosta M.D.  ASSISTANTS: none   ANESTHESIA:   spinal  EBL:  Total I/O In: 2000 [I.V.:2000] Out: 1350 [Urine:150; Blood:1200]  BLOOD ADMINISTERED:none  DRAINS: Urinary Catheter (Foley)   LOCAL MEDICATIONS USED:  NONE  SPECIMEN:  Source of Specimen:  Placenta delivered delivery  DISPOSITION OF SPECIMEN:  Labor and delivery  COUNTS:  YES  TOURNIQUET:  * No tourniquets in log *  DICTATION: .Dragon Dictation  PLAN OF CARE: Admit to inpatient   PATIENT DISPOSITION:  PACU - hemodynamically stable.   Delay start of Pharmacological VTE agent (>24hrs) due to surgical blood loss or risk of bleeding: not applicable   Details of procedure: Patient was taken to the operating room prepped and draped for lower abdominal incision, timeout conducted and Ancef administered 2 g intravenously. Transverse lower abdominal incision was made in the method of Pfannenstiel dissection through the skin, primarily blunt dissection through the fatty tissues fascia opened transversely sharply dissected off the underlying tissues the peritoneum opened in the midline, an Alexis wound retractor positioned in place. Transverse uterine incision was performed without difficulty with bladder retracting away with the help of the Alexis retractor. Was a vigorous amount of bleeding due to varicosities on the anterior lower uterine segment related to the anterior position of the placenta, large amount of amniotic fluid was encountered and the fetal feet quickly identified. Breech extraction performed by Dr. Loreta AveAcosta with sweeping of the arms from the  body and easy delivery of the head, using fundal pressure to guide and provide pressure. Infant's cord was clamped and the infant was passed to waiting pediatrician. There was 2 loops of cord around the fetal body.. There was minimal Wharton's jelly on the cord. It was a healthy female infant see pediatrician's notes for details. Placenta delivered and response to bimanual uterine massage was good. Pitocin was administered while pressure applied to the vigorously bleeding incision, then upon increased uterine tone the bleeding improved dramatically. Assistants were placed on the incision edges and once uterine tone was excellent hemostasis was dramatically improved. A figure-of-eight suture was placed at the right uterine angle of the incision and tagged. The uterus was then closed left-to-right in a continuous running locking suture of 0 Monocryl followed by oversewing suture continuous running 0 Monocryl. Hemostasis was good. The uterus which had been exteriorized was held up while the abdomen irrigated generously the uterus repositioned into abdominally, and further irrigation performed. Tolerated this relatively well. Incision was inspected, confirmed as hemostatic, and the anterior peritoneum closed with running 2-0 Vicryl, the fascia closed with running 0 Vicryl, subcutaneous tissue approximated with 4 interrupted sutures of 20 plain and then subcuticular 4-0 Vicryl close the skin incision. The estimated blood loss was at least 1200 cc weighted to the initial uterine incision and vigorous bleeding related to the anterior placental location. Sponge and needle counts were correct throughout

## 2014-12-19 NOTE — Anesthesia Postprocedure Evaluation (Signed)
  Anesthesia Post-op Note  Patient: Kylie White  Procedure(s) Performed: Procedure(s): PRIMARY CESAREAN SECTION (N/A)   Patient is awake, responsive, moving her legs, and has signs of resolution of her numbness. Pain and nausea are reasonably well controlled. Vital signs are stable and clinically acceptable. Oxygen saturation is clinically acceptable. There are no apparent anesthetic complications at this time. Patient is ready for discharge.

## 2014-12-19 NOTE — Transfer of Care (Signed)
Immediate Anesthesia Transfer of Care Note  Patient: Kylie White  Procedure(s) Performed: Procedure(s): PRIMARY CESAREAN SECTION (N/A)  Patient Location: PACU  Anesthesia Type:Spinal  Level of Consciousness: awake, alert  and oriented  Airway & Oxygen Therapy: Patient Spontanous Breathing  Post-op Assessment: Report given to PACU RN and Post -op Vital signs reviewed and stable  Post vital signs: Reviewed  Complications: No apparent anesthesia complications

## 2014-12-19 NOTE — Brief Op Note (Signed)
12/19/2014  2:38 PM  PATIENT:  Kylie White  29 y.o. female  PRE-OPERATIVE DIAGNOSIS:  PREGNANCY 39 WKS/BREECH, footling  POST-OPERATIVE DIAGNOSIS:  PREGNANCY 39 WKS/BREECH, footling, delivered  PROCEDURE:  Procedure(s): PRIMARY CESAREAN SECTION (N/A) low transverse cervical  SURGEON:  Surgeon(s) and Role:    * Tilda BurrowJohn Reshma Hoey V, MD - Primary  PHYSICIAN ASSISTANT: Loreta AveAcosta M.D.  ASSISTANTS: none   ANESTHESIA:   spinal  EBL:  Total I/O In: 2000 [I.V.:2000] Out: 1350 [Urine:150; Blood:1200]  BLOOD ADMINISTERED:none  DRAINS: Urinary Catheter (Foley)   LOCAL MEDICATIONS USED:  NONE  SPECIMEN:  Source of Specimen:  Placenta delivered delivery  DISPOSITION OF SPECIMEN:  Labor and delivery  COUNTS:  YES  TOURNIQUET:  * No tourniquets in log *  DICTATION: .Dragon Dictation  PLAN OF CARE: Admit to inpatient   PATIENT DISPOSITION:  PACU - hemodynamically stable.   Delay start of Pharmacological VTE agent (>24hrs) due to surgical blood loss or risk of bleeding: not applicable

## 2014-12-19 NOTE — Anesthesia Preprocedure Evaluation (Signed)
Anesthesia Evaluation  Patient identified by MRN, date of birth, ID band Patient awake    Reviewed: Allergy & Precautions, H&P , NPO status , Patient's Chart, lab work & pertinent test results  Airway Mallampati: II       Dental   Pulmonary asthma ,  breath sounds clear to auscultation        Cardiovascular Exercise Tolerance: Good Rhythm:regular Rate:Normal     Neuro/Psych  Headaches,    GI/Hepatic GERD-  ,  Endo/Other    Renal/GU      Musculoskeletal   Abdominal   Peds  Hematology   Anesthesia Other Findings   Reproductive/Obstetrics (+) Pregnancy                             Anesthesia Physical Anesthesia Plan  ASA: II  Anesthesia Plan: Spinal   Post-op Pain Management:    Induction:   Airway Management Planned:   Additional Equipment:   Intra-op Plan:   Post-operative Plan:   Informed Consent: I have reviewed the patients History and Physical, chart, labs and discussed the procedure including the risks, benefits and alternatives for the proposed anesthesia with the patient or authorized representative who has indicated his/her understanding and acceptance.     Plan Discussed with: Anesthesiologist, CRNA and Surgeon  Anesthesia Plan Comments:         Anesthesia Quick Evaluation

## 2014-12-19 NOTE — Anesthesia Procedure Notes (Signed)
Spinal Patient location during procedure: OR Preanesthetic Checklist Completed: patient identified, site marked, surgical consent, pre-op evaluation, timeout performed, IV checked, risks and benefits discussed and monitors and equipment checked Spinal Block Patient position: sitting Prep: DuraPrep Patient monitoring: heart rate, cardiac monitor, continuous pulse ox and blood pressure Approach: midline Location: L3-4 Injection technique: single-shot Needle Needle type: Sprotte  Needle gauge: 24 G Needle length: 9 cm Assessment Sensory level: T4 Additional Notes Spinal Dosage in OR  Bupivicaine ml       1.8 PFMS04   mcg        125 Fentanyl mcg            25

## 2014-12-19 NOTE — H&P (Signed)
Kylie JohnsLauren S White is a 29 y.o. female presenting for primary cesarean section for footling breech at 39+1 wk, declining ECV. History OB History    Gravida Para Term Preterm AB TAB SAB Ectopic Multiple Living   1              Past Medical History  Diagnosis Date  . Asthma     exercised induced  . Medical history non-contributory   . GERD (gastroesophageal reflux disease)   . Headache     MIGRAINES   Past Surgical History  Procedure Laterality Date  . Gallbladder surgery    . Tonsillectomy and adenoidectomy    . Wrist surgery Left   . Cholecystectomy    . Tonsillectomy    . Wisdom tooth extraction     Family History: family history includes Cancer in her maternal grandfather; Heart disease in her paternal grandfather; Hypercholesterolemia in her mother; Hypertension in her maternal grandmother and mother. Social History:  reports that she has never smoked. She has never used smokeless tobacco. She reports that she does not drink alcohol or use illicit drugs.   Prenatal Transfer Tool  Maternal Diabetes: No Genetic Screening: Normal Maternal Ultrasounds/Referrals: Normal Fetal Ultrasounds or other Referrals:  None Maternal Substance Abuse:  No Significant Maternal Medications:  None Significant Maternal Lab Results:  Lab values include: Group B Strep negative Other Comments:  None  ROS    Blood pressure 125/77, pulse 88, temperature 98.1 F (36.7 C), temperature source Oral, resp. rate 20, SpO2 100 %. Exam Physical Exam  Constitutional: She is oriented to person, place, and time. She appears well-developed and well-nourished.  Eyes: Pupils are equal, round, and reactive to light.  Neck: Normal range of motion.  Cardiovascular: Normal rate.   Respiratory: Effort normal.  GI: Soft.  Gravid uterus, u/s confirms breech with feet at cervix  Genitourinary:  defer  Musculoskeletal: Normal range of motion.  Neurological: She is alert and oriented to person, place, and time.   Skin: Skin is warm and dry.  Psychiatric: She has a normal mood and affect. Her behavior is normal. Thought content normal.    Prenatal labs: ABO, Rh: --/--/A POS, A POS (01/06 1000) Antibody: NEG (01/06 1000) Rubella: 0.31 (06/23 1416) RPR: NON REAC (01/06 1000)  HBsAg: NEGATIVE (06/23 1416)  HIV: NONREACTIVE (10/15 0913)  GBS: NOT DETECTED (12/23 1416)   Assessment/Plan: Pregnancy 6736w1d Footling breech, declining ECV   Plan; Primary Cesarean section.  [name] 12/19/2014, 1:10 PM

## 2014-12-20 ENCOUNTER — Encounter (HOSPITAL_COMMUNITY): Payer: Self-pay | Admitting: Obstetrics and Gynecology

## 2014-12-20 LAB — CBC
HCT: 24.2 % — ABNORMAL LOW (ref 36.0–46.0)
Hemoglobin: 8.1 g/dL — ABNORMAL LOW (ref 12.0–15.0)
MCH: 25.6 pg — ABNORMAL LOW (ref 26.0–34.0)
MCHC: 33.5 g/dL (ref 30.0–36.0)
MCV: 76.6 fL — ABNORMAL LOW (ref 78.0–100.0)
Platelets: 155 10*3/uL (ref 150–400)
RBC: 3.16 MIL/uL — AB (ref 3.87–5.11)
RDW: 14.4 % (ref 11.5–15.5)
WBC: 16.1 10*3/uL — ABNORMAL HIGH (ref 4.0–10.5)

## 2014-12-20 LAB — BIRTH TISSUE RECOVERY COLLECTION (PLACENTA DONATION)

## 2014-12-20 MED ORDER — ONDANSETRON HCL 4 MG/2ML IJ SOLN: 4.0000 mg | Freq: Three times a day (TID) | INTRAMUSCULAR | Status: DC | PRN

## 2014-12-20 MED ORDER — SCOPOLAMINE 1 MG/3DAYS TD PT72: 1.0000 | MEDICATED_PATCH | Freq: Once | TRANSDERMAL | Status: DC

## 2014-12-20 MED ORDER — MEASLES, MUMPS & RUBELLA VAC ~~LOC~~ INJ
0.5000 mL | INJECTION | Freq: Once | SUBCUTANEOUS | Status: DC
  Filled 2014-12-20: qty 0.5

## 2014-12-20 MED ORDER — DIPHENHYDRAMINE HCL 50 MG/ML IJ SOLN: 12.5000 mg | INTRAMUSCULAR | Status: DC | PRN

## 2014-12-20 MED ORDER — NALOXONE HCL 0.4 MG/ML IJ SOLN: 0.4000 mg | INTRAMUSCULAR | Status: DC | PRN

## 2014-12-20 MED ORDER — NALBUPHINE HCL 10 MG/ML IJ SOLN: 5.0000 mg | Freq: Once | INTRAMUSCULAR | Status: AC | PRN

## 2014-12-20 MED ORDER — NALBUPHINE HCL 10 MG/ML IJ SOLN
5.0000 mg | INTRAMUSCULAR | Status: DC | PRN
Start: 2014-12-19 — End: 2014-12-22

## 2014-12-20 MED ORDER — ACETAMINOPHEN 500 MG PO TABS: 1000.0000 mg | ORAL_TABLET | Freq: Four times a day (QID) | ORAL | Status: AC

## 2014-12-20 MED ORDER — DIPHENHYDRAMINE HCL 25 MG PO CAPS: 25.0000 mg | ORAL_CAPSULE | ORAL | Status: DC | PRN

## 2014-12-20 MED ORDER — DEXTROSE 5 % IV SOLN
1.0000 ug/kg/h | INTRAVENOUS | Status: DC | PRN
  Filled 2014-12-20: qty 2

## 2014-12-20 MED ORDER — NALBUPHINE HCL 10 MG/ML IJ SOLN: 5.0000 mg | INTRAMUSCULAR | Status: DC | PRN

## 2014-12-20 MED ORDER — SODIUM CHLORIDE 0.9 % IJ SOLN: 3.0000 mL | INTRAMUSCULAR | Status: DC | PRN

## 2014-12-20 MED ORDER — IBUPROFEN 600 MG PO TABS: 600.0000 mg | ORAL_TABLET | Freq: Four times a day (QID) | ORAL | Status: DC | PRN

## 2014-12-20 NOTE — Lactation Note (Signed)
This note was copied from the chart of Kylie Daisy BlossomLauren Crevier. Lactation Consultation Note  Assessed baby's mouth and he has limited mobility of his tongue, he is unable to protrude his tongue out of mouth. The tip of his tongue has indention.  Suggest she consult her Ped MD regarding tie. Assisted mother in latching in football hold.  Worked with her on achieving depth.  Baby has a recessed chin. Demonstrated to mother how to do chin tug to achieve a deeper latch, unlatch and massage to keep him active. Mother was able to express good flow of colostrum.  Sucks and swallows observed. Provided mother with comfort gels. Discussed cluster feeding. Suggest she feed for approx 15-20 min on one breast, burp and breastfeed on other. Discussed if we notice any weight loss she can start post pumping with DEBP.  Will pass on to her nurse [name].  Patient Name: Kylie White LOVFI'EToday's Date: 12/20/2014 Reason for consult: Follow-up assessment   Maternal Data Has patient been taught Hand Expression?: Yes Does the patient have breastfeeding experience prior to this delivery?: No  Feeding Feeding Type: Breast Fed Length of feed: 20 min  LATCH Score/Interventions Latch: Grasps breast easily, tongue down, lips flanged, rhythmical sucking. Intervention(s): Adjust position  Audible Swallowing: A few with stimulation Intervention(s): Alternate breast massage  Type of Nipple: Everted at rest and after stimulation  Comfort (Breast/Nipple): Soft / non-tender     Hold (Positioning): Assistance needed to correctly position infant at breast and maintain latch.  LATCH Score: 8  Lactation Tools Discussed/Used     Consult Status Consult Status: Follow-up Date: 12/21/14 Follow-up type: In-patient    Dahlia ByesBerkelhammer, Donia Yokum [facility] 12/20/2014, 1:19 PM

## 2014-12-20 NOTE — Progress Notes (Signed)
Subjective: Postpartum Day 1: Cesarean Delivery Patient reports incisional pain, tolerating PO, + flatus and no problems voiding.    Objective: Vital signs in last 24 hours: Temp:  [97.4 F (36.3 C)-98.3 F (36.8 C)] 97.8 F (36.6 C) (01/08 0646) Pulse Rate:  [62-96] 72 (01/08 0646) Resp:  [13-23] 18 (01/08 0646) BP: (96-125)/(51-106) 107/54 mmHg (01/08 0646) SpO2:  [98 %-100 %] 100 % (01/08 0646) Weight:  [102.74 kg (226 lb 8 oz)] 102.74 kg (226 lb 8 oz) (01/08 0300)  Physical Exam:  General: alert, cooperative, appears stated age and no distress Lochia: appropriate Uterine Fundus: firm Incision: no significant drainage, no dehiscence, no significant erythema DVT Evaluation: No evidence of DVT seen on physical exam.   Recent Labs  12/18/14 1000 12/20/14 0605  HGB 11.5* 8.1*  HCT 34.5* 24.2*    Assessment/Plan: Status post Cesarean section. Doing well postoperatively.  Continue current care.  Kathee DeltonMcKeag, Ian D 12/20/2014, 10:26 AM

## 2014-12-20 NOTE — Anesthesia Postprocedure Evaluation (Signed)
  Anesthesia Post-op Note  Anesthesia Post Note  Patient: Kylie White  Procedure(s) Performed: Procedure(s) (LRB): PRIMARY CESAREAN SECTION (N/A)  Anesthesia type: Spinal  Patient location: Mother/Baby  Post pain: Pain level controlled  Post assessment: Post-op Vital signs reviewed  Last Vitals:  Filed Vitals:   12/20/14 0646  BP: 107/54  Pulse: 72  Temp: 36.6 C  Resp: 18    Post vital signs: Reviewed  Level of consciousness: awake  Complications: No apparent anesthesia complications

## 2014-12-20 NOTE — Lactation Note (Signed)
This note was copied from the chart of Boy Daisy BlossomLauren Ropp. Lactation Consultation Note  P1, Baby sleeping.  Mother would like help with latching. Left LC phone number and encouraged her to call w/ next feeding. Mom encouraged to feed baby 8-12 times/24 hours and with feeding cues.  Reviewed cluster feeding. Mom made aware of O/P services, breastfeeding support groups, community resources, and our phone # for post-discharge questions.      Patient Name: Boy Daisy BlossomLauren Scheaffer Today's Date: 12/20/2014 Reason for consult: Initial assessment   Maternal Data Has patient been taught Hand Expression?: Yes Does the patient have breastfeeding experience prior to this delivery?: No  Feeding Feeding Type: Breast Fed  LATCH Score/Interventions Latch: Repeated attempts needed to sustain latch, nipple held in mouth throughout feeding, stimulation needed to elicit sucking reflex. Intervention(s): Skin to skin;Waking techniques Intervention(s): Adjust position;Assist with latch  Audible Swallowing: Spontaneous and intermittent Intervention(s): Skin to skin  Type of Nipple: Everted at rest and after stimulation  Comfort (Breast/Nipple): Soft / non-tender     Hold (Positioning): Assistance needed to correctly position infant at breast and maintain latch.  LATCH Score: 8  Lactation Tools Discussed/Used     Consult Status Consult Status: Follow-up Date: 12/21/14 Follow-up type: In-patient    Dahlia ByesBerkelhammer, Elyon Zoll [facility] 12/20/2014, 11:43 AM

## 2014-12-20 NOTE — Addendum Note (Signed)
Addendum  created 12/20/14 [id number] by Turner DanielsJennifer L Eria Lozoya, CRNA   Modules edited: Notes Section   Notes Section:  File: [id number]

## 2014-12-21 MED ORDER — OXYCODONE-ACETAMINOPHEN 5-325 MG PO TABS: 1.0000 | ORAL_TABLET | ORAL | Status: DC | PRN

## 2014-12-21 MED ORDER — FERROUS SULFATE 325 (65 FE) MG PO TABS: 325.0000 mg | ORAL_TABLET | Freq: Two times a day (BID) | ORAL | Status: DC

## 2014-12-21 MED ORDER — IBUPROFEN 600 MG PO TABS: 600.0000 mg | ORAL_TABLET | Freq: Four times a day (QID) | ORAL | Status: DC | PRN

## 2014-12-21 MED ORDER — MEASLES, MUMPS & RUBELLA VAC ~~LOC~~ INJ
0.5000 mL | INJECTION | SUBCUTANEOUS | Status: AC | PRN
  Administered 2014-12-22: 0.5 mL via SUBCUTANEOUS

## 2014-12-21 NOTE — Discharge Summary (Signed)
Obstetric Discharge Summary Reason for Admission: cesarean section- breech Prenatal Procedures: none Intrapartum Procedures: cesarean: low cervical, transverse Postpartum Procedures: none Complications-Operative and Postpartum: none HEMOGLOBIN  Date Value Ref Range Status  12/20/2014 8.1* 12.0 - 15.0 g/dL Final    Comment:    DELTA CHECK NOTED REPEATED TO VERIFY    HCT  Date Value Ref Range Status  12/20/2014 24.2* 36.0 - 46.0 % Final   [name] is a 29yo G1 who presented for a scheduled section for a known breech presentation with no desire for ECV. She tolerated the procedure well and by POD#2 is deemed to have received the full benefit of her hospital stay. She is breastfeeding and desires [facility] for [facility] infant [facility]. She will review contraception at that point.  Physical Exam:  General: alert, cooperative and no distress  Heart: RRR Lungs: nl effort Lochia: appropriate Uterine Fundus: firm Incision: honeycomb intact DVT Evaluation: No evidence of DVT seen on physical exam.  Discharge Diagnoses: Term Pregnancy-delivered  Discharge Information: Date: 12/21/2014 Activity: pelvic rest Diet: routine Medications: PNV, Ibuprofen, Iron and Percocet Condition: stable Instructions: refer to practice specific booklet Discharge to: home Follow-up Information    Follow up with [facility].   Why:  Call on Monday to schedule the circumcision as well as your incision check.   Contact information:   [address] [place] [id number] [phone]      Newborn Data: Live born female  Birth Weight: 8 lb 6.4 oz (3810 g) APGAR: 8, 9  Home with mother.  Kylie B. Jarvis NewcomerGrunz, MD, PGY-2 12/22/2014 8:54 AM

## 2014-12-21 NOTE — Lactation Note (Signed)
This note was copied from the chart of Kylie White [place]. Lactation Consultation Note  Patient Name: Kylie White Propps GNFAO'ZToday's Date: 12/21/2014 Reason for consult: Follow-up assessment  LC observed the Baby already latched in a consistent swallowing pattern, increased with breast compressions. Per mom initially feels pinching with latch and then eases up. LC assessed baby's oral cavity after feeding  And noted a short labial frenulum , and a short anterior frenulum which this finding indicates the in pinching mom must be  Feeling . Mom already has comfort gels, instructed on use breast shells. LC recommended prior to latch breast massage , hand express, Pre-pump with hand pump few strokes to make the base of the nipple areola more elastic for a deeper latch. Also to have the Pedis MD assess the baby's oral cavity in am. LC obtained the [place] [facility]BP [facility] for moms benefit pump for the healthy pregnancy program. LC instructed both mom and dad on the  Operation of pump.    Maternal Data Has patient been taught Hand Expression?: Yes  Feeding Feeding Type:  (baby feeding in a consistent pattern ) Length of feed: 10 min (LC observed the baby already feeding, multiply swallows )  LATCH Score/Interventions Latch:  (latched with depth )  Audible Swallowing:  (mutliply swallows noted )  Type of Nipple: Everted at rest and after stimulation  Comfort (Breast/Nipple): Filling, red/small blisters or bruises, mild/mod discomfort  Problem noted: Mild/Moderate discomfort Interventions (Mild/moderate discomfort): Comfort gels  Hold (Positioning):  (mom independent with latch ) Intervention(s): Breastfeeding basics reviewed (see LC note )  LATCH Score: 9  Lactation Tools Discussed/Used WIC Program: No   Consult Status Consult Status: Follow-up Date: 12/22/14 Follow-up type: In-patient    Kathrin Greathouseorio, Daphney Hopke Ann 12/21/2014, 5:30 PM

## 2014-12-21 NOTE — Discharge Instructions (Signed)

## 2014-12-22 NOTE — Lactation Note (Signed)
This note was copied from the chart of Kylie White. Lactation Consultation Note  Patient Name: Kylie White YNWGN'FToday's Date: 12/22/2014 Reason for consult: Follow-up assessment;Breast/nipple pain;Infant weight loss  Baby is 4367 hours old , latching well at the breast , milk is in and free flowing. LC observed a depth latch, multiply swallows, increased with breast compressions. Per mom the pinching has improved, now it is just intermittent. LC reviewed sore nipple and engorgement prevention and tx.  Mother informed of post-discharge support and given phone number to the lactation department, including services  for phone call assistance; out-patient appointments; and breastfeeding support group. List of other breastfeeding resources  in the community given in the handout. Encouraged mother to call for problems or concerns related to breastfeeding.   Maternal Data    Feeding Feeding Type: Breast Fed Length of feed: 10 min  LATCH Score/Interventions Latch: Grasps breast easily, tongue down, lips flanged, rhythmical sucking. Intervention(s): Skin to skin;Teach feeding cues;Waking techniques Intervention(s): Adjust position;Assist with latch;Breast massage;Breast compression  Audible Swallowing: Spontaneous and intermittent  Type of Nipple: Everted at rest and after stimulation  Comfort (Breast/Nipple): Soft / non-tender  Problem noted: Mild/Moderate discomfort Interventions (Mild/moderate discomfort): Comfort gels  Hold (Positioning): Assistance needed to correctly position infant at breast and maintain latch. Intervention(s): Breastfeeding basics reviewed;Support Pillows;Position options;Skin to skin  LATCH Score: 9  Lactation Tools Discussed/Used Tools: Shells;Comfort gels Shell Type: Inverted Pump Review: Setup, frequency, and cleaning;Milk Storage Initiated by:: MAI  Date initiated:: 12/21/14   Consult Status Consult Status: Complete Date:  12/22/14    Kathrin Greathouseorio, Zamora Colton Ann 12/22/2014, 9:39 AM

## 2014-12-30 ENCOUNTER — Ambulatory Visit (INDEPENDENT_AMBULATORY_CARE_PROVIDER_SITE_OTHER): Payer: 59 | Admitting: Obstetrics and Gynecology

## 2014-12-30 ENCOUNTER — Encounter: Payer: Self-pay | Admitting: Obstetrics and Gynecology

## 2014-12-30 VITALS — BP 120/72 | Ht 68.0 in | Wt 206.0 lb

## 2014-12-30 DIAGNOSIS — Z9889 Other specified postprocedural states: Secondary | ICD-10-CM

## 2014-12-30 NOTE — Progress Notes (Signed)
Patient ID: Kylie White, female   DOB: 24-Nov-1986, 29 y.o.   MRN: [id number] Pt here today for incision check. Pt denies any drainage, or redness at her incision just some tenderness.    Subjective:  Kylie JohnsLauren S White is a 29 y.o. female who presents to the clinic 1.5 weeks status post cesarean.   no c/o Review of Systems Negative except na  She has been eating a regular diet without difficulty.   Bowel movements are normal. The patient is not having any pain.  Objective:  BP 120/72 mmHg  Ht 5\' 8"  (1.727 m)  Wt 206 lb (93.441 kg)  BMI 31.33 kg/m2  Breastfeeding? Yes General:Well developed, well nourished.  No acute distress. Abdomen: Bowel sounds normal, soft, non-tender. Pelvic Exam:    External Genitalia:  Normal.     Incision(s):   Healing well, no drainage, no erythema, no hernia, no swelling, no dehiscence, incision well approximated.right edge slightly asymmetric   Assessment:  Post-Op 2 weeks s/p cesarean section   Doing well postoperatively.   Plan:  1.Wound care discussed   2. .Continue any current medications. 3. Activity restrictions: none 4. return to work: may drive.. 5. Follow up in 4 week.

## 2015-01-27 ENCOUNTER — Encounter: Payer: 59 | Admitting: Obstetrics and Gynecology

## 2015-01-29 ENCOUNTER — Encounter: Payer: Self-pay | Admitting: Obstetrics and Gynecology

## 2015-01-29 ENCOUNTER — Ambulatory Visit (INDEPENDENT_AMBULATORY_CARE_PROVIDER_SITE_OTHER): Payer: 59 | Admitting: Obstetrics and Gynecology

## 2015-01-29 DIAGNOSIS — Z3202 Encounter for pregnancy test, result negative: Secondary | ICD-10-CM

## 2015-01-29 DIAGNOSIS — Z3200 Encounter for pregnancy test, result unknown: Secondary | ICD-10-CM

## 2015-01-29 LAB — POCT URINE PREGNANCY: Preg Test, Ur: NEGATIVE

## 2015-01-29 MED ORDER — NORETHINDRONE 0.35 MG PO TABS: 1.0000 | ORAL_TABLET | Freq: Every day | ORAL | Status: DC

## 2015-01-29 NOTE — Progress Notes (Signed)
Subjective:      Kylie White is a 29 y.o. female who presents to the clinic 4 weeks status post cesarean for breech. Eating a regular diet without difficulty. Bowel movements are normal. Pain is controlled without any medications.pain 2/10 Incision had some granulation tissue on its center portion, no infection The following portions of the patient's history were reviewed and updated as appropriate: none.  Review of Systems Pertinent items are noted in HPI.    Objective:    BP 120/78 mmHg  Ht 5\' 8"  (1.727 m)  Wt 201 lb (91.173 kg)  BMI 30.57 kg/m2  Breastfeeding? Yes General:  alert, cooperative and no distress  Abdomen: soft, bowel sounds active, non-tender  Incision:   healing well, no drainage, no erythema, no hernia, no seroma, no swelling, no dehiscence, incision well approximated     Assessment:    Postoperative course complicated by granulation tissue at center of pfannensteil incison Operative findings again reviewed. Pathology report discussed.    Plan:    1. Continue any current medications. 2. Wound care discussed. 3. Activity restrictions: gradually increase sports,including yoga and interval jogging 4. Anticipated return to work: april 1. 5. Follow up: prn  for problems. .   6. BC POP's called in

## 2015-01-29 NOTE — Patient Instructions (Signed)
Norethindrone tablets (contraception) What is this medicine? NORETHINDRONE (nor eth IN drone) is an oral contraceptive. The product contains a female hormone known as a progestin. It is used to prevent pregnancy. This medicine may be used for other purposes; ask your health care provider or pharmacist if you have questions. COMMON BRAND NAME(S): Camila, Deblitane 28-Day, Errin, Heather, Jencycla, Jolivette, Lyza, Nor-QD, Nora-BE, Norlyroc, Ortho Micronor, Sharobel 28-Day What should I tell my health care provider before I take this medicine? They need to know if you have any of these conditions: -blood vessel disease or blood clots -breast, cervical, or vaginal cancer -diabetes -heart disease -kidney disease -liver disease -mental depression -migraine -seizures -stroke -vaginal bleeding -an unusual or allergic reaction to norethindrone, other medicines, foods, dyes, or preservatives -pregnant or trying to get pregnant -breast-feeding How should I use this medicine? Take this medicine by mouth with a glass of water. You may take it with or without food. Follow the directions on the prescription label. Take this medicine at the same time each day and in the order directed on the package. Do not take your medicine more often than directed. Contact your pediatrician regarding the use of this medicine in children. Special care may be needed. This medicine has been used in female children who have started having menstrual periods. A patient package insert for the product will be given with each prescription and refill. Read this sheet carefully each time. The sheet may change frequently. Overdosage: If you think you have taken too much of this medicine contact a poison control center or emergency room at once. NOTE: This medicine is only for you. Do not share this medicine with others. What if I miss a dose? Try not to miss a dose. Every time you miss a dose or take a dose late your chance of  pregnancy increases. When 1 pill is missed (even if only 3 hours late), take the missed pill as soon as possible and continue taking a pill each day at the regular time (use a back up method of birth control for the next 48 hours). If more than 1 dose is missed, use an additional birth control method for the rest of your pill pack until menses occurs. Contact your health care professional if more than 1 dose has been missed. What may interact with this medicine? Do not take this medicine with any of the following medications: -amprenavir or fosamprenavir -bosentan This medicine may also interact with the following medications: -antibiotics or medicines for infections, especially rifampin, rifabutin, rifapentine, and griseofulvin, and possibly penicillins or tetracyclines -aprepitant -barbiturate medicines, such as phenobarbital -carbamazepine -felbamate -modafinil -oxcarbazepine -phenytoin -ritonavir or other medicines for HIV infection or AIDS -St. Odalys Win's wort -topiramate This list may not describe all possible interactions. Give your health care provider a list of all the medicines, herbs, non-prescription drugs, or dietary supplements you use. Also tell them if you smoke, drink alcohol, or use illegal drugs. Some items may interact with your medicine. What should I watch for while using this medicine? Visit your doctor or health care professional for regular checks on your progress. You will need a regular breast and pelvic exam and Pap smear while on this medicine. Use an additional method of birth control during the first cycle that you take these tablets. If you have any reason to think you are pregnant, stop taking this medicine right away and contact your doctor or health care professional. If you are taking this medicine for hormone related problems, it   may take several cycles of use to see improvement in your condition. This medicine does not protect you against HIV infection (AIDS)  or any other sexually transmitted diseases. What side effects may I notice from receiving this medicine? Side effects that you should report to your doctor or health care professional as soon as possible: -breast tenderness or discharge -pain in the abdomen, chest, groin or leg -severe headache -skin rash, itching, or hives -sudden shortness of breath -unusually weak or tired -vision or speech problems -yellowing of skin or eyes Side effects that usually do not require medical attention (report to your doctor or health care professional if they continue or are bothersome): -changes in sexual desire -change in menstrual flow -facial hair growth -fluid retention and swelling -headache -irritability -nausea -weight gain or loss This list may not describe all possible side effects. Call your doctor for medical advice about side effects. You may report side effects to FDA at 1-800-FDA-1088. Where should I keep my medicine? Keep out of the reach of children. Store at room temperature between 15 and 30 degrees C (59 and 86 degrees F). Throw away any unused medicine after the expiration date. NOTE: This sheet is a summary. It may not cover all possible information. If you have questions about this medicine, talk to your doctor, pharmacist, or health care provider.  2015, Elsevier/Gold Standard. (2012-08-18 16:41:35)  

## 2015-10-29 ENCOUNTER — Ambulatory Visit: Payer: Self-pay | Admitting: Physician Assistant

## 2015-10-29 ENCOUNTER — Encounter: Payer: Self-pay | Admitting: Physician Assistant

## 2015-10-29 VITALS — BP 108/70 | HR 78 | Temp 98.2°F

## 2015-10-29 DIAGNOSIS — J019 Acute sinusitis, unspecified: Secondary | ICD-10-CM

## 2015-10-29 MED ORDER — AMOXICILLIN 875 MG PO TABS: 875.0000 mg | ORAL_TABLET | Freq: Two times a day (BID) | ORAL | Status: DC

## 2015-10-29 NOTE — Progress Notes (Signed)
S/ cold sx x one wk, now with R max pain and tenderness ears stopped up , not taken anything  Breast feeding her 4010 month old who has also been sick  O/ VSS alert mildly ill appearing NAD ENT tms retracted , nasal mucosa inflamed , R max tenderness crepitus R TMJ  Chronic , pharynx injected neck supple without nodes heart rsr lungs clear A/ Sinusitis  P /amoxicillan 875 mg bid , BUM x one month while on antbx .nasal saline products bid and prn F./u with pediatrician on breast feeding instructions with antbx  And it is fine

## 2015-12-25 ENCOUNTER — Other Ambulatory Visit: Payer: 59 | Admitting: Adult Health

## 2016-01-01 ENCOUNTER — Other Ambulatory Visit: Payer: Self-pay | Admitting: Obstetrics and Gynecology

## 2016-01-05 NOTE — Telephone Encounter (Signed)
Rx, plus refil x 2 months to allow pt to have time to come in for med appt.

## 2016-01-08 ENCOUNTER — Encounter: Payer: Self-pay | Admitting: Adult Health

## 2016-01-08 ENCOUNTER — Ambulatory Visit (INDEPENDENT_AMBULATORY_CARE_PROVIDER_SITE_OTHER): Payer: 59 | Admitting: Adult Health

## 2016-01-08 VITALS — BP 120/68 | HR 106 | Ht 67.0 in | Wt 167.0 lb

## 2016-01-08 DIAGNOSIS — Z01419 Encounter for gynecological examination (general) (routine) without abnormal findings: Secondary | ICD-10-CM

## 2016-01-08 DIAGNOSIS — Z309 Encounter for contraceptive management, unspecified: Secondary | ICD-10-CM | POA: Insufficient documentation

## 2016-01-08 DIAGNOSIS — Z789 Other specified health status: Secondary | ICD-10-CM

## 2016-01-08 DIAGNOSIS — Z3041 Encounter for surveillance of contraceptive pills: Secondary | ICD-10-CM

## 2016-01-08 HISTORY — DX: Other specified health status: Z78.9

## 2016-01-08 HISTORY — DX: Encounter for contraceptive management, unspecified: Z30.9

## 2016-01-08 MED ORDER — NORETHINDRONE 0.35 MG PO TABS: 1.0000 | ORAL_TABLET | Freq: Every day | ORAL | Status: DC

## 2016-01-08 NOTE — Progress Notes (Signed)
Patient ID: Kylie White, female   DOB: 1986/11/15, 30 y.o.   MRN: [id number] History of Present Illness: Kylie White is a 30 year old white female, married in for a well woman gyn exam, she had a normal pap 06/04/14.She is still breastfeeding, and she is on [place]. She is working at [employer] in [place].   Current Medications, Allergies, Past Medical History, Past Surgical History, Family History and Social History were reviewed in [employer] record.     Review of Systems: Patient denies any headaches, hearing loss, fatigue, blurred vision, shortness of breath, chest pain, abdominal pain, problems with bowel movements, urination, or intercourse. No joint pain or mood swings.No period yet.Wants to continue to breastfeed at night, told could pump and freeze for 6 months, he is a year old but likes breast milk more than whole milk, and she eating from table.    Physical Exam:BP 120/68 mmHg  Pulse 106  Ht  (1.702 m)  Wt 167 lb (75.751 kg)  BMI 26.15 kg/m2  Breastfeeding? Yes General:  Well developed, well nourished, no acute distress Skin:  Warm and dry Neck:  Midline trachea, normal thyroid, good ROM, no lymphadenopathy Lungs; Clear to auscultation bilaterally Breast:  No dominant palpable mass, retraction, or nipple discharge, has milk on right  Cardiovascular: Regular rate and rhythm Abdomen:  Soft, non tender, no hepatosplenomegaly, well healed pfannenstiel  incision  Pelvic:  External genitalia is normal in appearance, no lesions.  The vagina is normal in appearance. Urethra has no lesions or masses. The cervix is smooth, and non tender.  Uterus is felt to be normal size, shape, and contour.  No adnexal masses or tenderness noted.Bladder is non tender, no masses felt. Extremities/musculoskeletal:  No swelling or varicosities noted, no clubbing or cyanosis Psych:  No mood changes, alert and cooperative,seems happy   Impression:  Well woman gyn exam no  pap Contraceptive management Breastfeeding    Plan: Pap and physical in 1 year Refilled sharobel x 1 year, call when stops breastfeeding and will Rx COC

## 2016-01-08 NOTE — Patient Instructions (Signed)
Pap and physical in 1 year Continue sharobel, call when stops breastfeeding

## 2016-02-25 ENCOUNTER — Telehealth: Payer: Self-pay | Admitting: Adult Health

## 2016-02-25 MED ORDER — NORETHIN-ETH ESTRAD-FE BIPHAS 1 MG-10 MCG / 10 MCG PO TABS: 1.0000 | ORAL_TABLET | Freq: Every day | ORAL | Status: DC

## 2016-02-25 NOTE — Telephone Encounter (Signed)
Stopped breast feeding , wants to start pill Rx lo loestrin ,use condoms

## 2016-04-13 ENCOUNTER — Encounter: Payer: Self-pay | Admitting: Physician Assistant

## 2016-04-13 ENCOUNTER — Ambulatory Visit: Payer: Self-pay | Admitting: Physician Assistant

## 2016-04-13 VITALS — BP 110/60 | HR 60 | Temp 97.8°F

## 2016-04-13 DIAGNOSIS — H6000 Abscess of external ear, unspecified ear: Secondary | ICD-10-CM

## 2016-04-13 DIAGNOSIS — H6123 Impacted cerumen, bilateral: Secondary | ICD-10-CM

## 2016-04-13 NOTE — Progress Notes (Signed)
S:  C/o ears popping and being stopped up, no drainage from ears, no fever/chills, no cough or congestion, some sinus pressure, remainder ros neg, muffled hearing Using otc meds without relief  O:  Vitals wnl, nad, ear canals + cerumen impaction b/l, + furuncle in left ear, nasal mucosa swollen, throat wnl, neck supple no lymph, lungs c t a, cv rrr, neuro intact  A: cerumen impaction, furuncle  P: debrox x 2 days, h202 to furuncle, return in 2 days for irrigation

## 2016-04-15 ENCOUNTER — Ambulatory Visit: Payer: Self-pay | Admitting: Physician Assistant

## 2016-05-11 ENCOUNTER — Telehealth: Payer: Self-pay | Admitting: Adult Health

## 2016-05-11 MED ORDER — NORETHINDRONE 0.35 MG PO TABS: 1.0000 | ORAL_TABLET | Freq: Every day | ORAL | Status: DC

## 2016-05-11 NOTE — Telephone Encounter (Signed)
Spoke with pt. Pt has been on Lo Loestrin and she thinks it's causing migraines. + nausea also. Pt stopped birth control Saturday and has had no headache x 2 days. She was having headaches everyday. Pt is requesting a different birth control pill or whatever you recommend. Thanks!! JSY

## 2016-05-11 NOTE — Telephone Encounter (Signed)
Had headache with lo loestrin and they stopped when stopped lo loestrin, will try micronor, did not have headaches when on POP in past, use condoms for 1 pack

## 2016-05-11 NOTE — Telephone Encounter (Signed)
Pt called stating that the birth control Kylie White Put her on has been giving her migraines and she has been feeling sick. Pt would like to know if Kylie White Could change her Birth control. Please contact pt

## 2016-05-11 NOTE — Telephone Encounter (Signed)
Left message to call back,

## 2016-05-11 NOTE — Addendum Note (Signed)
Addended by: Cyril MourningGRIFFIN, JENNIFER A on: 05/11/2016 03:12 PM   Modules accepted: Orders, Medications

## 2016-06-09 ENCOUNTER — Encounter: Payer: Self-pay | Admitting: Physician Assistant

## 2016-06-09 ENCOUNTER — Ambulatory Visit: Payer: Self-pay | Admitting: Physician Assistant

## 2016-06-09 VITALS — BP 100/74 | HR 72 | Temp 98.4°F

## 2016-06-09 DIAGNOSIS — J029 Acute pharyngitis, unspecified: Secondary | ICD-10-CM

## 2016-06-09 MED ORDER — AZITHROMYCIN 250 MG PO TABS: ORAL_TABLET | ORAL | Status: DC

## 2016-06-09 MED ORDER — BENZONATATE 200 MG PO CAPS: 200.0000 mg | ORAL_CAPSULE | Freq: Three times a day (TID) | ORAL | Status: DC | PRN

## 2016-06-09 NOTE — Progress Notes (Signed)
S: C/o runny nose and congestion with sore throat for 3 days, no fever, chills, cp/sob, v/d; mucus was green this am but clear throughout the day, cough is sporadic, child and husband had same sx but didn't have sore throat  Using otc meds: robitussin  O: PE: vitals wnl, nad, perrl eomi, normocephalic, tms dull, nasal mucosa red and swollen, throat red, cobblestoned, neck supple no lymph, lungs c t a, cv rrr, neuro intact  A:  Acute  uri   P: zpack, tessalon perls, drink fluids, continue regular meds , use otc meds of choice, return if not improving in 5 days, return earlier if worsening

## 2016-06-23 ENCOUNTER — Ambulatory Visit: Payer: Self-pay | Admitting: Physician Assistant

## 2016-06-23 VITALS — BP 90/70 | HR 76 | Temp 98.7°F

## 2016-06-23 DIAGNOSIS — W57XXXA Bitten or stung by nonvenomous insect and other nonvenomous arthropods, initial encounter: Secondary | ICD-10-CM

## 2016-06-23 DIAGNOSIS — J029 Acute pharyngitis, unspecified: Secondary | ICD-10-CM

## 2016-06-23 NOTE — Progress Notes (Signed)
   Subjective:    Patient ID: Kylie JohnsLauren S White, female    DOB: [birth year], 30 y.o.   MRN: [id number]  HPI Patient c/o continual sore throat after finishing Z-pack. Also c/o itching from inset bite to back of neck. Denies fever/chills. No other palliative measures for compliant.    Review of Systems     Objective:   Physical Exam Erythematous pharynx without cervical adenopathy. Papular lesion on erythematous base posterior neck. Lungs CTA and Heart RRR.        Assessment & Plan:Local reaction insect bite. Pharyngitis.  Trial of Magic Mouthwash, Medrol dose pack, and Atrarax. Follow up 5 days if no improvement.

## 2017-01-11 ENCOUNTER — Encounter: Payer: Self-pay | Admitting: Physician Assistant

## 2017-01-11 ENCOUNTER — Ambulatory Visit: Payer: Self-pay | Admitting: Physician Assistant

## 2017-01-11 DIAGNOSIS — J0100 Acute maxillary sinusitis, unspecified: Secondary | ICD-10-CM

## 2017-01-11 MED ORDER — AZITHROMYCIN 250 MG PO TABS: ORAL_TABLET | ORAL | 0 refills | Status: DC

## 2017-01-11 MED ORDER — BENZONATATE 200 MG PO CAPS: 200.0000 mg | ORAL_CAPSULE | Freq: Three times a day (TID) | ORAL | 0 refills | Status: DC | PRN

## 2017-01-11 MED ORDER — FLUTICASONE PROPIONATE 50 MCG/ACT NA SUSP: 2.0000 | Freq: Every day | NASAL | 6 refills | Status: DC

## 2017-01-11 NOTE — Progress Notes (Signed)
S: C/o runny nose and congestion with harsh cough for 5 days, no fever, chills, cp/sob, v/d; mucus is green and thick, cough is sporadic, c/o of facial and dental pain. Ear pressure  Using otc meds:   O: PE: vitals wnl, nad, perrl eomi, normocephalic, tms dull, nasal mucosa red and swollen, throat injected, neck supple no lymph, lungs c t a, cv rrr, neuro intact  A:  Acute sinusitis   P: drink fluids, continue regular meds , use otc meds of choice, return if not improving in 5 days, return earlier if worsening , zpack, flonase, tessalon perls

## 2017-04-18 ENCOUNTER — Other Ambulatory Visit: Payer: Self-pay | Admitting: Adult Health

## 2017-04-27 ENCOUNTER — Other Ambulatory Visit: Payer: 59 | Admitting: Adult Health

## 2017-05-25 ENCOUNTER — Other Ambulatory Visit (HOSPITAL_COMMUNITY)
Admission: RE | Admit: 2017-05-25 | Discharge: 2017-05-25 | Disposition: A | Discharge status: Home / Self Care | Payer: 59 | Source: Ambulatory Visit | Attending: Adult Health | Admitting: Adult Health

## 2017-05-25 ENCOUNTER — Encounter: Payer: Self-pay | Admitting: Adult Health

## 2017-05-25 ENCOUNTER — Ambulatory Visit (INDEPENDENT_AMBULATORY_CARE_PROVIDER_SITE_OTHER): Payer: 59 | Admitting: Adult Health

## 2017-05-25 VITALS — BP 100/62 | HR 72 | Ht 68.75 in | Wt 167.0 lb

## 2017-05-25 DIAGNOSIS — R5383 Other fatigue: Secondary | ICD-10-CM | POA: Diagnosis not present

## 2017-05-25 DIAGNOSIS — Z30015 Encounter for initial prescription of vaginal ring hormonal contraceptive: Secondary | ICD-10-CM

## 2017-05-25 DIAGNOSIS — Z01419 Encounter for gynecological examination (general) (routine) without abnormal findings: Secondary | ICD-10-CM | POA: Diagnosis not present

## 2017-05-25 DIAGNOSIS — Z13220 Encounter for screening for lipoid disorders: Secondary | ICD-10-CM

## 2017-05-25 MED ORDER — ETONOGESTREL-ETHINYL ESTRADIOL 0.12-0.015 MG/24HR VA RING: VAGINAL_RING | VAGINAL | 4 refills | Status: DC

## 2017-05-25 NOTE — Addendum Note (Signed)
Addended by: Tish FredericksonLANCASTER, Benigna Delisi A on: 05/25/2017 09:43 AM   Modules accepted: Orders

## 2017-05-25 NOTE — Progress Notes (Signed)
Patient ID: Kylie White, female   DOB: [birth year], 31 y.o.   MRN: [id number] History of Present Illness: Kylie White is a 31 year old white female, married, G1P1 in for well woman gyn exam and pap, She has stopped breastfeeding(after 14 months) and is thinking of trying nuva ring.She is tired a lot, and is working in [place] at [employer].    Current Medications, Allergies, Past Medical History, Past Surgical History, Family History and Social History were reviewed in Kylie White electronic medical record.     Review of Systems: Patient denies any headaches, hearing loss, blurred vision, shortness of breath, chest pain, abdominal pain, problems with bowel movements, urination, or intercourse. No joint pain or mood swings.+tired    Physical Exam:BP 100/62 (BP Location: Right Arm, Patient Position: Sitting, Cuff Size: Normal)   Pulse 72   Ht 5' 8.75" (1.746 m)   Wt 167 lb (75.8 kg)   LMP 05/13/2017 (Exact Date)   Breastfeeding? No   BMI 24.84 kg/m  General:  Well developed, well nourished, no acute distress Skin:  Warm and dry,tan Neck:  Midline trachea, normal thyroid, good ROM, no lymphadenopathy Lungs; Clear to auscultation bilaterally Breast:  No dominant palpable mass, retraction, or nipple discharge Cardiovascular: Regular rate and rhythm Abdomen:  Soft, non tender, no hepatosplenomegaly Pelvic:  External genitalia is normal in appearance, no lesions.  The vagina is normal in appearance. Urethra has no lesions or masses. The cervix is bulbous. Pap with reflex HPV performed. Uterus is felt to be normal size, shape, and contour.  No adnexal masses or tenderness noted.Bladder is non tender, no masses felt. Extremities/musculoskeletal:  No swelling or varicosities noted, no clubbing or cyanosis Psych:  No mood changes, alert and cooperative,seems happy PHQ 2 score 1.  Impression: 1. Encounter for routine gynecological examination with Papanicolaou smear of cervix   2. Encounter for initial  prescription of vaginal ring hormonal contraceptive   3. Screening cholesterol level   4. Tired       Plan: Check CBC,CMP,TSH and lipids Meds ordered this encounter  Medications  . etonogestrel-ethinyl estradiol (NUVARING) 0.12-0.015 MG/24HR vaginal ring    Sig: Insert vaginally and leave in place for 25 days then remove for 3 days and then reinsert new ring    Dispense:  3 each    Refill:  4    Order Specific Question:   Supervising Provider    Answer:   Lazaro ArmsEURE, LUTHER H [[id number]]  Use condoms for 1 month Physical in 1 year, pap in 3 if normal

## 2017-05-26 ENCOUNTER — Encounter: Payer: Self-pay | Admitting: Adult Health

## 2017-05-26 LAB — LIPID PANEL
Chol/HDL Ratio: 3.7 ratio (ref 0.0–4.4)
Cholesterol, Total: 179 mg/dL (ref 100–199)
HDL: 49 mg/dL (ref >39)
LDL CALC: 121 mg/dL — AB (ref 0–99)
Triglycerides: 44 mg/dL (ref 0–149)
VLDL Cholesterol Cal: 9 mg/dL (ref 5–40)

## 2017-05-26 LAB — COMPREHENSIVE METABOLIC PANEL
A/G RATIO: 2.2 (ref 1.2–2.2)
ALBUMIN: 4.4 g/dL (ref 3.5–5.5)
ALT: 11 IU/L (ref 0–32)
AST: 13 IU/L (ref 0–40)
Alkaline Phosphatase: 58 IU/L (ref 39–117)
BILIRUBIN TOTAL: 1 mg/dL (ref 0.0–1.2)
BUN / CREAT RATIO: 13 (ref 9–23)
BUN: 11 mg/dL (ref 6–20)
CHLORIDE: 103 mmol/L (ref 96–106)
CO2: 22 mmol/L (ref 20–29)
Calcium: 9.3 mg/dL (ref 8.7–10.2)
Creatinine, Ser: 0.84 mg/dL (ref 0.57–1.00)
GFR calc Af Amer: 109 mL/min/{1.73_m2} (ref >59)
GFR, EST NON AFRICAN AMERICAN: 94 mL/min/{1.73_m2} (ref >59)
GLOBULIN, TOTAL: 2 g/dL (ref 1.5–4.5)
Glucose: 79 mg/dL (ref 65–99)
Potassium: 4.9 mmol/L (ref 3.5–5.2)
SODIUM: 140 mmol/L (ref 134–144)
Total Protein: 6.4 g/dL (ref 6.0–8.5)

## 2017-05-26 LAB — CBC
HEMATOCRIT: 41.7 % (ref 34.0–46.6)
Hemoglobin: 13.8 g/dL (ref 11.1–15.9)
MCH: 27.5 pg (ref 26.6–33.0)
MCHC: 33.1 g/dL (ref 31.5–35.7)
MCV: 83 fL (ref 79–97)
PLATELETS: 261 10*3/uL (ref 150–379)
RBC: 5.01 x10E6/uL (ref 3.77–5.28)
RDW: 13.7 % (ref 12.3–15.4)
WBC: 5.7 10*3/uL (ref 3.4–10.8)

## 2017-05-26 LAB — TSH: TSH: 2.08 u[IU]/mL (ref 0.450–4.500)

## 2017-05-30 LAB — CYTOLOGY - PAP
ADEQUACY: ABSENT
DIAGNOSIS: NEGATIVE
HPV (WINDOPATH): NOT DETECTED

## 2017-07-01 ENCOUNTER — Ambulatory Visit: Payer: Self-pay | Admitting: Physician Assistant

## 2017-07-01 ENCOUNTER — Encounter: Payer: Self-pay | Admitting: Physician Assistant

## 2017-07-01 VITALS — BP 100/72 | HR 60 | Temp 98.2°F

## 2017-07-01 DIAGNOSIS — H6980 Other specified disorders of Eustachian tube, unspecified ear: Secondary | ICD-10-CM

## 2017-07-01 MED ORDER — PREDNISONE 10 MG PO TABS: 30.0000 mg | ORAL_TABLET | Freq: Every day | ORAL | 0 refills | Status: DC

## 2017-07-01 NOTE — Progress Notes (Signed)
S:  C/o r ear pain, feels stopped up, no drainage from ears, no fever/chills, no cough or congestion, some sinus pressure, remainder ros neg, headache also, feels like a sinus headache, no mucus production; also recently started the nuvaring Using otc meds without relief  O:  Vitals wnl, nad, r ear canal + wax, irrigated with warm water, wax removed, tms dull b/l, nasal mucosa swollen, throat wnl, neck supple no lymph, lungs c t a, cv rrr, neuro intact  A: acute eustachean tube dysfunction  P: flonase, sudafed, prednisone 30mg  qd x 3d, return if not improving in 3 to 5 days, return earlier if worsening

## 2017-08-08 ENCOUNTER — Encounter: Payer: Self-pay | Admitting: Physician Assistant

## 2017-08-08 ENCOUNTER — Ambulatory Visit: Payer: Self-pay | Admitting: Physician Assistant

## 2017-08-08 VITALS — BP 110/78 | HR 77 | Temp 98.7°F

## 2017-08-08 DIAGNOSIS — H1031 Unspecified acute conjunctivitis, right eye: Secondary | ICD-10-CM

## 2017-08-08 MED ORDER — TOBRAMYCIN 0.3 % OP SOLN: 2.0000 [drp] | OPHTHALMIC | 0 refills | Status: DC

## 2017-08-08 NOTE — Progress Notes (Signed)
S:  C/o right eye being irritated, red, gritty feeling, sx started last pm, no matting or drainage,  denies, cough, congestion, fever, chills, v/d; remainder ros neg  O: vitals wnl, nad, perrl eomi, right eye with injected conjunctiva, no drainage or matting noted at this time, tms clear, nasal mucosa inflamed, throat wnl, neck supple no lymph, lungs c t a, cv rrr  A:  Acute conjunctivitis  P: tobramycin opth gtts, return if not better in 3 - 5d, if worsening return earlier or see eye doctor, no work until eye is no longer red or draining

## 2017-11-09 ENCOUNTER — Encounter: Payer: Self-pay | Admitting: Adult Health

## 2017-12-10 ENCOUNTER — Encounter: Payer: Self-pay | Admitting: Adult Health

## 2017-12-12 ENCOUNTER — Other Ambulatory Visit: Payer: Self-pay | Admitting: Adult Health

## 2017-12-12 MED ORDER — ETONOGESTREL-ETHINYL ESTRADIOL 0.12-0.015 MG/24HR VA RING: VAGINAL_RING | VAGINAL | 4 refills | Status: DC

## 2017-12-12 NOTE — Progress Notes (Signed)
Pt requests [facility] on [facility]va ring[facility] [place]

## 2021-04-29 ENCOUNTER — Ambulatory Visit (INDEPENDENT_AMBULATORY_CARE_PROVIDER_SITE_OTHER): Payer: 59 | Admitting: Internal Medicine

## 2021-04-29 ENCOUNTER — Encounter: Payer: Self-pay | Admitting: Internal Medicine

## 2021-04-29 ENCOUNTER — Other Ambulatory Visit: Payer: Self-pay

## 2021-04-29 VITALS — BP 98/68 | HR 88 | Temp 98.3°F | Ht 67.0 in | Wt 190.0 lb

## 2021-04-29 DIAGNOSIS — M549 Dorsalgia, unspecified: Secondary | ICD-10-CM | POA: Diagnosis not present

## 2021-04-29 DIAGNOSIS — Z579 Occupational exposure to unspecified risk factor: Secondary | ICD-10-CM

## 2021-04-29 MED ORDER — BICTEGRAVIR-EMTRICITAB-TENOFOV 50-200-25 MG PO TABS: 1.0000 | ORAL_TABLET | Freq: Every day | ORAL | 0 refills | Status: DC

## 2021-04-29 NOTE — Progress Notes (Signed)
[facility]  Reason for Consult:post exposure prophylaxis blood borne path Referring Provider: [employer]    Patient Active Problem List   Diagnosis Date Noted  . Tired 05/25/2017  . Contraceptive management 01/08/2016  . Breastfeeding (infant) 01/08/2016  . Status post cesarean delivery 12/19/2014  . Low lying placenta without hemorrhage, antepartum 07/30/2014  . Rubella non-immune status, antepartum 06/05/2014  . Supervision of normal first pregnancy 06/04/2014      HPI: Kylie White is a 35 y.o. female referred here for post-exposure prophylaxis of blood borne pathologen  Patient was doing a procedure. Patient was injected in the leg with lidocaine. Back pressure occurred and the lidocaine that was in the leg splashed on patient's eyes. This occurred today 04/29/2021  Patient is not pregnant and is not breast feeding.  Patient is hiv and appeared well controlled as of 3 months ago; cd4 250. Patient is on tivicay/descovy. Patient has no documented hep b/c. Patient is a female in her 68, non-ivdu (transferred from her exhusband a long time ago)  Patient is an ultrasound tech at [employer]  Patient is not trying to get pregnant. Patient is s/p tubal ligation.     Review of Systems: ROS All other ros negative      Past Medical History:  Diagnosis Date  . Asthma    exercised induced  . Breastfeeding (infant) 01/08/2016  . Contraceptive management 01/08/2016  . GERD (gastroesophageal reflux disease)   . Headache    MIGRAINES  . Medical history non-contributory     Social History   Tobacco Use  . Smoking status: Never Smoker  . Smokeless tobacco: Never Used  Substance Use Topics  . Alcohol use: No    Alcohol/week: 0.0 standard drinks    Comment: occassionally   . Drug use: No    Family History  Problem Relation Age of Onset  . Hypertension Maternal Grandmother   . Cancer Maternal  Grandfather        skin  . Hypertension Mother   . Hypercholesterolemia Mother   . Heart disease Paternal Grandfather        heart attack    No Known Allergies  OBJECTIVE: Vitals:   04/29/21 1452  BP: 98/68  Pulse: 88  Temp: 98.3 F (36.8 C)  TempSrc: Oral  SpO2: 98%  Weight: 190 lb (86.2 kg)  Height: 5\' 7"  (1.702 m)   Body mass index is 29.76 kg/m.   Physical Exam General/constitutional: no distress, pleasant HEENT: Normocephalic, PER, Conj Clear, EOMI, Oropharynx clear Neck supple CV: rrr no mrg Lungs: clear to auscultation, normal respiratory effort Abd: Soft, Nontender Ext: no edema Skin: No Rash Neuro: nonfocal MSK: no peripheral joint swelling/tenderness/warmth; back spines nontender Psych: alert/oriented  Lab:  Microbiology:  Serology:  Imaging:   Assessment/plan: Problem List Items Addressed This Visit   None   Visit Diagnoses    Occupational exposure in workplace    -  Primary   Relevant Orders   HIV Antibody (routine testing w rflx)   Hepatitis, Acute        Discussed risk for blood borne related transmission. Her risk for hiv acquirement is very low < 0.09%. patient has no known hepatittis disease. And patient is presumably vaccinated for hep b  Extensive counseling given today  -f/u on her hep b vaccination and patient's hepatitis serology and let me know ASP within 1 week -f/u 6 weeks with repeat hiv/hepatitis testing -  labs today       Follow-up: Return in about 6 weeks (around 06/10/2021).    I spent 60 minute reviewing data/chart, and coordinating care and >50% direct face to face time providing counseling/discussing diagnostics/treatment plan with patient   Raymondo Band, MD [facility] [phone] pager   [phone] cell 04/29/2021, 3:06 PM

## 2021-04-29 NOTE — Patient Instructions (Signed)
Check patient's status on hepatitis serology   Hepatitis B surface antigen, surface antibody, and core antibody, and hepatitis DNA if possible If the patient is a known hepatitis b carrier and you don't have prior hepatitis b vaccine, I would vaccinate you   Labs today, then 6 weeks, and 6 months Follow up with me in around 6 weeks

## 2021-05-01 LAB — HEPATITIS PANEL, ACUTE
Hep A IgM: NONREACTIVE
Hep B C IgM: NONREACTIVE
Hepatitis B Surface Ag: NONREACTIVE
Hepatitis C Ab: NONREACTIVE
SIGNAL TO CUT-OFF: 0 (ref <1.00)

## 2021-05-01 LAB — TEST AUTHORIZATION

## 2021-05-01 LAB — COMPREHENSIVE METABOLIC PANEL
AG Ratio: 1.9 (calc) (ref 1.0–2.5)
ALT: 9 U/L (ref 6–29)
AST: 11 U/L (ref 10–30)
Albumin: 4.3 g/dL (ref 3.6–5.1)
Alkaline phosphatase (APISO): 56 U/L (ref 31–125)
BUN: 9 mg/dL (ref 7–25)
CO2: 27 mmol/L (ref 20–32)
Calcium: 9.4 mg/dL (ref 8.6–10.2)
Chloride: 105 mmol/L (ref 98–110)
Creat: 0.79 mg/dL (ref 0.50–1.10)
Globulin: 2.3 g/dL (calc) (ref 1.9–3.7)
Glucose, Bld: 92 mg/dL (ref 65–99)
Potassium: 4.4 mmol/L (ref 3.5–5.3)
Sodium: 141 mmol/L (ref 135–146)
Total Bilirubin: 0.5 mg/dL (ref 0.2–1.2)
Total Protein: 6.6 g/dL (ref 6.1–8.1)

## 2021-05-01 LAB — HEPATITIS B SURFACE ANTIBODY, QUANTITATIVE: Hep B S AB Quant (Post): 37 m[IU]/mL (ref >=10)

## 2021-05-01 LAB — HIV ANTIBODY (ROUTINE TESTING W REFLEX): HIV 1&2 Ab, 4th Generation: NONREACTIVE

## 2021-05-27 ENCOUNTER — Other Ambulatory Visit: Payer: Self-pay | Admitting: Internal Medicine

## 2021-06-18 ENCOUNTER — Other Ambulatory Visit: Payer: 59

## 2021-06-18 ENCOUNTER — Other Ambulatory Visit: Payer: Self-pay

## 2021-06-18 DIAGNOSIS — Z579 Occupational exposure to unspecified risk factor: Secondary | ICD-10-CM

## 2021-06-20 LAB — COMPREHENSIVE METABOLIC PANEL
AG Ratio: 1.9 (calc) (ref 1.0–2.5)
ALT: 8 U/L (ref 6–29)
AST: 11 U/L (ref 10–30)
Albumin: 4.2 g/dL (ref 3.6–5.1)
Alkaline phosphatase (APISO): 50 U/L (ref 31–125)
BUN: 11 mg/dL (ref 7–25)
CO2: 26 mmol/L (ref 20–32)
Calcium: 8.8 mg/dL (ref 8.6–10.2)
Chloride: 107 mmol/L (ref 98–110)
Creat: 0.82 mg/dL (ref 0.50–1.10)
Globulin: 2.2 g/dL (calc) (ref 1.9–3.7)
Glucose, Bld: 85 mg/dL (ref 65–99)
Potassium: 4.5 mmol/L (ref 3.5–5.3)
Sodium: 139 mmol/L (ref 135–146)
Total Bilirubin: 0.7 mg/dL (ref 0.2–1.2)
Total Protein: 6.4 g/dL (ref 6.1–8.1)

## 2021-06-20 LAB — HIV-1 RNA QUANT-NO REFLEX-BLD
HIV 1 RNA Quant: NOT DETECTED Copies/mL
HIV-1 RNA Quant, Log: NOT DETECTED Log cps/mL

## 2021-06-20 LAB — HEPATITIS PANEL, ACUTE
Hep A IgM: NONREACTIVE
Hep B C IgM: NONREACTIVE
Hepatitis B Surface Ag: NONREACTIVE
Hepatitis C Ab: NONREACTIVE
SIGNAL TO CUT-OFF: 0.01 (ref <1.00)

## 2021-06-20 LAB — HIV ANTIBODY (ROUTINE TESTING W REFLEX): HIV 1&2 Ab, 4th Generation: NONREACTIVE

## 2021-07-01 ENCOUNTER — Telehealth: Payer: Self-pay

## 2021-07-01 NOTE — Telephone Encounter (Signed)
Patient will need 07/08/21 appointment canceled and will need to be rescheduled with Dr. Renold Don five months from now. Called patient, no answer. Left HIPAA compliant voicemail requesting callback.   Sandie Ano, RN

## 2021-07-01 NOTE — Telephone Encounter (Signed)
-----   Message from Raymondo Band, MD sent at 07/01/2021  3:24 PM EDT ----- I have let patient know of her normal result and to see me in 5 months for repeat hiv/hepatitis testing off PEP therapy  thanks

## 2021-07-02 ENCOUNTER — Telehealth: Payer: Self-pay

## 2021-07-02 NOTE — Telephone Encounter (Signed)
Patient has been schd  

## 2021-07-02 NOTE — Telephone Encounter (Signed)
Left patient a voice mail to call back to reschedule 07/08/21 appointment with Dr. Renold Don for a  five months from now 07/02/21.

## 2021-07-03 ENCOUNTER — Ambulatory Visit: Payer: 59 | Admitting: Internal Medicine

## 2021-07-08 ENCOUNTER — Ambulatory Visit: Payer: 59 | Admitting: Internal Medicine

## 2021-08-10 ENCOUNTER — Ambulatory Visit
Admission: RE | Admit: 2021-08-10 | Discharge: 2021-08-10 | Disposition: A | Discharge status: Home / Self Care | Payer: 59 | Source: Ambulatory Visit | Attending: Family Medicine | Admitting: Family Medicine

## 2021-08-10 ENCOUNTER — Other Ambulatory Visit: Payer: Self-pay

## 2021-08-10 VITALS — BP 121/74 | HR 94 | Temp 98.1°F | Resp 17

## 2021-08-10 DIAGNOSIS — J0140 Acute pansinusitis, unspecified: Secondary | ICD-10-CM

## 2021-08-10 DIAGNOSIS — J209 Acute bronchitis, unspecified: Secondary | ICD-10-CM

## 2021-08-10 MED ORDER — ALBUTEROL SULFATE HFA 108 (90 BASE) MCG/ACT IN AERS
2.0000 | INHALATION_SPRAY | RESPIRATORY_TRACT | Status: DC | PRN
  Administered 2021-08-10: 2 via RESPIRATORY_TRACT

## 2021-08-10 MED ORDER — PREDNISONE 20 MG PO TABS: 40.0000 mg | ORAL_TABLET | Freq: Every day | ORAL | 0 refills | Status: DC

## 2021-08-10 MED ORDER — CEFDINIR 300 MG PO CAPS: 300.0000 mg | ORAL_CAPSULE | Freq: Two times a day (BID) | ORAL | 0 refills | Status: AC

## 2021-08-10 MED ORDER — PROMETHAZINE-DM 6.25-15 MG/5ML PO SYRP: 5.0000 mL | ORAL_SOLUTION | Freq: Four times a day (QID) | ORAL | 0 refills | Status: DC | PRN

## 2021-08-10 NOTE — ED Triage Notes (Signed)
Pt is present today with nasal congestion, sinus pressure, cough, HA, and ear fullness. Pt states that her sx started last Monday. Pt recently had covid six weeks ago

## 2021-08-10 NOTE — ED Provider Notes (Signed)
[facility]    CSN: [id number] Arrival date & time: 08/10/21  1044      History   Chief Complaint Chief Complaint  Patient presents with   Cough   Nasal Congestion   Headache   Ear Fullness    HPI Kylie White is a 35 y.o. female.   HPI Patient presents with URI symptoms including cough, headache, otalgia, nasal congestion, runny nose, and sinus pressure. Denies worrisome symptoms of shortness of breath, weakness, or N&V.  History of exercise-induced asthma and has noticed wheezing throughout the course of her current illness.  All symptoms have been present for a little over 1 week.  Patient had COVID 6 weeks ago, and reports that all of her symptoms related to COVID completely resolved prior to current symptoms. Past Medical History:  Diagnosis Date   Asthma    exercised induced   Breastfeeding (infant) 01/08/2016   Contraceptive management 01/08/2016   GERD (gastroesophageal reflux disease)    Headache    MIGRAINES   Medical history non-contributory     Patient Active Problem List   Diagnosis Date Noted   Tired 05/25/2017   Contraceptive management 01/08/2016   Breastfeeding (infant) 01/08/2016   Status post cesarean delivery 12/19/2014   Low lying placenta without hemorrhage, antepartum 07/30/2014   Rubella non-immune status, antepartum 06/05/2014   Supervision of normal first pregnancy 06/04/2014    Past Surgical History:  Procedure Laterality Date   CESAREAN SECTION N/A 12/19/2014   Procedure: PRIMARY CESAREAN SECTION;  Surgeon: Tilda Burrow, MD;  Location: [facility];  Service: Obstetrics;  Laterality: N/A;   CHOLECYSTECTOMY     GALLBLADDER SURGERY     TONSILLECTOMY     TONSILLECTOMY AND ADENOIDECTOMY     WISDOM TOOTH EXTRACTION     WRIST SURGERY Left     OB History     Gravida  1   Para  1   Term  1   Preterm      AB      Living  1      SAB      IAB      Ectopic      Multiple  0   Live Births  1             Home Medications    Prior to Admission medications   Medication Sig Start Date End Date Taking? Authorizing Provider  cefdinir (OMNICEF) 300 MG capsule Take 1 capsule (300 mg total) by mouth 2 (two) times daily for 10 days. 08/10/21 08/20/21 Yes Bing Neighbors, FNP  predniSONE (DELTASONE) 20 MG tablet Take 2 tablets (40 mg total) by mouth daily with breakfast. 08/10/21  Yes Bing Neighbors, FNP  promethazine-dextromethorphan (PROMETHAZINE-DM) 6.25-15 MG/5ML syrup Take 5 mLs by mouth 4 (four) times daily as needed for cough. 08/10/21  Yes Bing Neighbors, FNP  bictegravir-emtricitabine-tenofovir AF (BIKTARVY) 50-200-25 MG TABS tablet Take 1 tablet by mouth daily. 04/29/21   Raymondo Band, MD  etonogestrel-ethinyl estradiol (NUVARING) 0.12-0.015 MG/24HR vaginal ring Insert vaginally and leave in place for 25 days then remove for 3 days and then reinsert new ring Patient not taking: No sig reported 12/12/17   Cyril Mourning A, NP  fluticasone (FLONASE) 50 MCG/ACT nasal spray Place 2 sprays into both nostrils daily. 01/11/17   Fisher, Roselyn Bering, PA-C  tobramycin (TOBREX) 0.3 % ophthalmic solution Place 2 drops into both eyes every 4 (four) hours. 08/08/17   Faythe Ghee, PA-C  Family History Family History  Problem Relation Age of Onset   Hypertension Maternal Grandmother    Cancer Maternal Grandfather        skin   Hypertension Mother    Hypercholesterolemia Mother    Heart disease Paternal Grandfather        heart attack    Social History Social History   Tobacco Use   Smoking status: Never   Smokeless tobacco: Never  Substance Use Topics   Alcohol use: No    Alcohol/week: 0.0 standard drinks    Comment: occassionally    Drug use: No     Allergies   Patient has no known allergies.   Review of Systems Review of Systems Pertinent negatives listed in HPI   Physical Exam Triage Vital Signs ED Triage Vitals [08/10/21 1113]  Enc Vitals Group     BP 121/74      Pulse Rate 94     Resp 17     Temp 98.1 F (36.7 C)     Temp Source Oral     SpO2 95 %     Weight      Height      Head Circumference      Peak Flow      Pain Score 7     Pain Loc      Pain Edu?      Excl. in GC?    No data found.  Updated Vital Signs BP 121/74   Pulse 94   Temp 98.1 F (36.7 C) (Oral)   Resp 17   SpO2 95%   Visual Acuity Right Eye Distance:   Left Eye Distance:   Bilateral Distance:    Right Eye Near:   Left Eye Near:    Bilateral Near:     Physical Exam General appearance: Alert, Ill-appearing, no distress Head: Normocephalic, without obvious abnormality, atraumatic ENT: Left with MEE, mucosal edema, congestion,  oropharynx w/o exudate Respiratory: Respirations even , unlabored, coarse lung sound, expiratory wheeze Heart: rate and rhythm normal. No gallop or murmurs noted on exam  Abdomen: BS +, no distention, no rebound tenderness, or no mass Extremities: No gross deformities Skin: Skin color, texture, turgor normal. No rashes seen  Psych: Appropriate mood and affect. Neurologic: GCS-15, normal coordination, normal gait  UC Treatments / Results  Labs (all labs ordered are listed, but only abnormal results are displayed) Labs Reviewed - No data to display  EKG   Radiology No results found.  Procedures Procedures (including critical care time)  Medications Ordered in UC Medications  albuterol (VENTOLIN HFA) 108 (90 Base) MCG/ACT inhaler 2 puff (2 puffs Inhalation Given 08/10/21 1254)    Initial Impression / Assessment and Plan / UC Course  I have reviewed the triage vital signs and the nursing notes.  Pertinent labs & imaging results that were available during my care of the patient were reviewed by me and considered in my medical decision making (see chart for details).    Acute bronchitis with acute sinusitis Patient received 2 puffs of albuterol here in clinic for acute wheezing Treatment per discharge medication  orders. Strict return precautions if symptoms worsen or do not improve. Final Clinical Impressions(s) / UC Diagnoses   Final diagnoses:  Acute non-recurrent pansinusitis  Acute bronchitis, unspecified organism   Discharge Instructions   None    ED Prescriptions     Medication Sig Dispense Auth. Provider   cefdinir (OMNICEF) 300 MG capsule Take 1 capsule (300 mg total) by mouth  2 (two) times daily for 10 days. 20 capsule Bing Neighbors, FNP   promethazine-dextromethorphan (PROMETHAZINE-DM) 6.25-15 MG/5ML syrup Take 5 mLs by mouth 4 (four) times daily as needed for cough. 140 mL Bing Neighbors, FNP   predniSONE (DELTASONE) 20 MG tablet Take 2 tablets (40 mg total) by mouth daily with breakfast. 10 tablet Bing Neighbors, FNP      PDMP not reviewed this encounter.   Bing Neighbors, FNP 08/10/21 1330

## 2021-08-16 ENCOUNTER — Ambulatory Visit (INDEPENDENT_AMBULATORY_CARE_PROVIDER_SITE_OTHER): Payer: 59

## 2021-08-16 ENCOUNTER — Ambulatory Visit
Admission: RE | Admit: 2021-08-16 | Discharge: 2021-08-16 | Disposition: A | Discharge status: Home / Self Care | Payer: 59 | Source: Ambulatory Visit | Attending: Internal Medicine | Admitting: Internal Medicine

## 2021-08-16 VITALS — BP 110/74 | HR 84 | Temp 98.9°F | Resp 16

## 2021-08-16 DIAGNOSIS — R058 Other specified cough: Secondary | ICD-10-CM

## 2021-08-16 DIAGNOSIS — R059 Cough, unspecified: Secondary | ICD-10-CM

## 2021-08-16 MED ORDER — BENZONATATE 100 MG PO CAPS: 100.0000 mg | ORAL_CAPSULE | Freq: Three times a day (TID) | ORAL | 0 refills | Status: DC

## 2021-08-16 MED ORDER — HYDROCOD POLST-CPM POLST ER 10-8 MG/5ML PO SUER: 5.0000 mL | Freq: Two times a day (BID) | ORAL | 0 refills | Status: DC | PRN

## 2021-08-16 NOTE — Discharge Instructions (Signed)
Cough following viral infection especially bronchitis may take up to 4 weeks to resolve Your chest x-ray was negative for pneumonia Maintain adequate hydration You do not need any more steroids Return to urgent care if symptoms worsen

## 2021-08-16 NOTE — ED Provider Notes (Signed)
[facility]    CSN: [id number] Arrival date & time: 08/16/21  1044      History   Chief Complaint No chief complaint on file.   HPI Kylie White is a 35 y.o. female comes to the urgent care on the second occasion for cough.  Patient was seen last week for nonproductive cough of a few days duration.  He had no sore throat no nausea vomiting or diarrhea.  No fever or chills.  She had back pain associated with a cough.  Patient was given a course of antibiotics and some steroids.  She is completed the steroids but continues to have nonproductive cough.  She has some chest tightness.  She continues to be afebrile.  No calf pain or tenderness.  No swelling in the legs.  No nausea, vomiting or diarrhea.Marland Kitchen   HPI  Past Medical History:  Diagnosis Date   Asthma    exercised induced   Breastfeeding (infant) 01/08/2016   Contraceptive management 01/08/2016   GERD (gastroesophageal reflux disease)    Headache    MIGRAINES   Medical history non-contributory     Patient Active Problem List   Diagnosis Date Noted   Tired 05/25/2017   Contraceptive management 01/08/2016   Breastfeeding (infant) 01/08/2016   Status post cesarean delivery 12/19/2014   Low lying placenta without hemorrhage, antepartum 07/30/2014   Rubella non-immune status, antepartum 06/05/2014   Supervision of normal first pregnancy 06/04/2014    Past Surgical History:  Procedure Laterality Date   CESAREAN SECTION N/A 12/19/2014   Procedure: PRIMARY CESAREAN SECTION;  Surgeon: Tilda Burrow, MD;  Location: [facility];  Service: Obstetrics;  Laterality: N/A;   CHOLECYSTECTOMY     GALLBLADDER SURGERY     TONSILLECTOMY     TONSILLECTOMY AND ADENOIDECTOMY     WISDOM TOOTH EXTRACTION     WRIST SURGERY Left     OB History     Gravida  1   Para  1   Term  1   Preterm      AB      Living  1      SAB      IAB      Ectopic      Multiple  0   Live Births  1            Home  Medications    Prior to Admission medications   Medication Sig Start Date End Date Taking? Authorizing Provider  chlorpheniramine-HYDROcodone (TUSSIONEX PENNKINETIC ER) 10-8 MG/5ML SUER Take 5 mLs by mouth every 12 (twelve) hours as needed for cough. 08/16/21  Yes Eboni Coval, Britta Mccreedy, MD  bictegravir-emtricitabine-tenofovir AF (BIKTARVY) 50-200-25 MG TABS tablet Take 1 tablet by mouth daily. 04/29/21   Vu, Gershon Mussel T, MD  cefdinir (OMNICEF) 300 MG capsule Take 1 capsule (300 mg total) by mouth 2 (two) times daily for 10 days. 08/10/21 08/20/21  Bing Neighbors, FNP  etonogestrel-ethinyl estradiol (NUVARING) 0.12-0.015 MG/24HR vaginal ring Insert vaginally and leave in place for 25 days then remove for 3 days and then reinsert new ring Patient not taking: No sig reported 12/12/17   Cyril Mourning A, NP  fluticasone (FLONASE) 50 MCG/ACT nasal spray Place 2 sprays into both nostrils daily. 01/11/17   Fisher, Roselyn Bering, PA-C  predniSONE (DELTASONE) 20 MG tablet Take 2 tablets (40 mg total) by mouth daily with breakfast. 08/10/21   Bing Neighbors, FNP  tobramycin (TOBREX) 0.3 % ophthalmic solution Place 2 drops into both  eyes every 4 (four) hours. 08/08/17   Faythe Ghee, PA-C    Family History Family History  Problem Relation Age of Onset   Hypertension Maternal Grandmother    Cancer Maternal Grandfather        skin   Hypertension Mother    Hypercholesterolemia Mother    Heart disease Paternal Grandfather        heart attack    Social History Social History   Tobacco Use   Smoking status: Never   Smokeless tobacco: Never  Substance Use Topics   Alcohol use: No    Alcohol/week: 0.0 standard drinks    Comment: occassionally    Drug use: No     Allergies   Patient has no known allergies.   Review of Systems Review of Systems  Constitutional: Negative.   HENT: Negative.    Respiratory:  Positive for cough and chest tightness. Negative for shortness of breath and wheezing.    Cardiovascular: Negative.   Gastrointestinal: Negative.     Physical Exam Triage Vital Signs ED Triage Vitals  Enc Vitals Group     BP 08/16/21 1112 110/74     Pulse Rate 08/16/21 1112 84     Resp 08/16/21 1112 16     Temp 08/16/21 1112 98.9 F (37.2 C)     Temp Source 08/16/21 1112 Oral     SpO2 08/16/21 1112 96 %     Weight --      Height --      Head Circumference --      Peak Flow --      Pain Score 08/16/21 1113 6     Pain Loc --      Pain Edu? --      Excl. in GC? --    No data found.  Updated Vital Signs BP 110/74 (BP Location: Right Arm)   Pulse 84   Temp 98.9 F (37.2 C) (Oral)   Resp 16   LMP 08/03/2021 (Approximate)   SpO2 96%   Visual Acuity Right Eye Distance:   Left Eye Distance:   Bilateral Distance:    Right Eye Near:   Left Eye Near:    Bilateral Near:     Physical Exam Vitals and nursing note reviewed.  Constitutional:      General: She is not in acute distress.    Appearance: She is not ill-appearing.  HENT:     Right Ear: Tympanic membrane normal.     Left Ear: Tympanic membrane normal.  Cardiovascular:     Rate and Rhythm: Normal rate and regular rhythm.     Pulses: Normal pulses.     Heart sounds: Normal heart sounds.  Pulmonary:     Effort: Pulmonary effort is normal. No respiratory distress.     Breath sounds: Normal breath sounds. No wheezing, rhonchi or rales.  Neurological:     Mental Status: She is alert.     UC Treatments / Results  Labs (all labs ordered are listed, but only abnormal results are displayed) Labs Reviewed - No data to display  EKG   Radiology DG Chest 2 View  Result Date: 08/16/2021 CLINICAL DATA:  Persistent cough EXAM: CHEST - 2 VIEW COMPARISON:  None. FINDINGS: The heart size and mediastinal contours are within normal limits. Both lungs are clear. The visualized skeletal structures are unremarkable. IMPRESSION: No active cardiopulmonary disease. Electronically Signed   By: Judie Petit.  Shick M.D.   On:  08/16/2021 12:07    Procedures Procedures (including  critical care time)  Medications Ordered in UC Medications - No data to display  Initial Impression / Assessment and Plan / UC Course  I have reviewed the triage vital signs and the nursing notes.  Pertinent labs & imaging results that were available during my care of the patient were reviewed by me and considered in my medical decision making (see chart for details).     1.  Postviral cough syndrome: Chest x-ray is negative for acute lung infiltrate Patient just completed a course of steroids Patient is on antibiotics and she is encouraged to complete a course of antibiotics I will prescribe Tussionex to use for cough Return precautions given Cough may last for up to a month. Final Clinical Impressions(s) / UC Diagnoses   Final diagnoses:  Post-viral cough syndrome     Discharge Instructions      Cough following viral infection especially bronchitis may take up to 4 weeks to resolve Your chest x-ray was negative for pneumonia Maintain adequate hydration You do not need any more steroids Return to urgent care if symptoms worsen   ED Prescriptions     Medication Sig Dispense Auth. Provider   chlorpheniramine-HYDROcodone (TUSSIONEX PENNKINETIC ER) 10-8 MG/5ML SUER Take 5 mLs by mouth every 12 (twelve) hours as needed for cough. 140 mL Fable Huisman, Britta Mccreedy, MD      PDMP not reviewed this encounter.   Merrilee Jansky, MD 08/16/21 1227

## 2021-08-16 NOTE — ED Triage Notes (Signed)
Was seen last week for ear infection and bronchitis.  States she does not feel any better,  continues to cough and states chest feel heavy.  States she has deep coughing spells with wheezing.

## 2021-11-20 ENCOUNTER — Other Ambulatory Visit: Payer: Self-pay

## 2021-11-20 ENCOUNTER — Ambulatory Visit (INDEPENDENT_AMBULATORY_CARE_PROVIDER_SITE_OTHER): Payer: 59 | Admitting: Internal Medicine

## 2021-11-20 VITALS — BP 102/72 | HR 70 | Resp 16 | Ht 67.0 in | Wt 190.0 lb

## 2021-11-20 DIAGNOSIS — Z7721 Contact with and (suspected) exposure to potentially hazardous body fluids: Secondary | ICD-10-CM | POA: Diagnosis not present

## 2021-11-20 NOTE — Progress Notes (Signed)
[facility]  Reason for Consult:post exposure prophylaxis blood borne path Referring Provider: [employer]    Patient Active Problem List   Diagnosis Date Noted   Tired 05/25/2017   Contraceptive management 01/08/2016   Breastfeeding (infant) 01/08/2016   Status post cesarean delivery 12/19/2014   Low lying placenta without hemorrhage, antepartum 07/30/2014   Rubella non-immune status, antepartum 06/05/2014   Supervision of normal first pregnancy 06/04/2014      HPI: Kylie White is a 35 y.o. female referred here for post-exposure prophylaxis of blood borne pathologen  Patient was doing a procedure. Patient was injected in the leg with lidocaine. Back pressure occurred and the lidocaine that was in the leg splashed on patient's eyes. This occurred today 04/29/2021  Patient is not pregnant and is not breast feeding.  Patient is hiv and appeared well controlled as of 3 months ago; cd4 250. Patient is on tivicay/descovy. Patient has no documented hep b/c. Patient is a female in her 86, non-ivdu (transferred from her exhusband a long time ago)  Patient is an ultrasound tech at [employer]  Patient is not trying to get pregnant. Patient is s/p tubal ligation.   -------------- 11/20/2021 id clinic f/u Patient finished PEP hiv course with biktarvy of 28 days Previous hep b vaccinated Hep c initial testing negative She is doing well      Review of Systems: ROS All other ros negative      Past Medical History:  Diagnosis Date   Asthma    exercised induced   Breastfeeding (infant) 01/08/2016   Contraceptive management 01/08/2016   GERD (gastroesophageal reflux disease)    Headache    MIGRAINES   Medical history non-contributory     Social History   Tobacco Use   Smoking status: Never   Smokeless tobacco: Never  Substance Use Topics   Alcohol use: No    Alcohol/week: 0.0 standard drinks     Comment: occassionally    Drug use: No    Family History  Problem Relation Age of Onset   Hypertension Maternal Grandmother    Cancer Maternal Grandfather        skin   Hypertension Mother    Hypercholesterolemia Mother    Heart disease Paternal Grandfather        heart attack    No Known Allergies  OBJECTIVE: There were no vitals filed for this visit.  There is no height or weight on file to calculate BMI.   Physical Exam General/constitutional: no distress, pleasant HEENT: Normocephalic, PER, Conj Clear, EOMI, Oropharynx clear Neck supple CV: rrr no mrg Lungs: clear to auscultation, normal respiratory effort Abd: Soft, Nontender Ext: no edema Skin: No Rash Neuro: nonfocal MSK: no peripheral joint swelling/tenderness/warmth; back spines nontender    Lab:  Microbiology:  Serology:  Imaging:   Assessment/plan: Problem List Items Addressed This Visit   None Visit Diagnoses     Exposure to blood-borne pathogen    -  Primary        Discussed risk for blood borne related transmission. Her risk for hiv acquirement is very low < 0.09%. patient has no known hepatittis disease. And patient is presumably vaccinated for hep b  Extensive counseling given today  ------------ 11/20/2021 assessment Finished 4 weeks pep Hep b vaccinated/responded prior to exposure  Repeat hep c/hiv testing ordered Mychart review for labs No need for f/u     Follow-up: No follow-ups on file.  Raymondo Band, MD [facility] [phone] pager   [phone] cell 11/20/2021, 1:51 PM

## 2021-11-20 NOTE — Patient Instructions (Signed)
You were previously immunized against hepatitis B  Will repeat hepatitis c and hiv testing today  Will forward results to you via mychart

## 2021-11-23 LAB — HIV-1 RNA QUANT-NO REFLEX-BLD
HIV 1 RNA Quant: NOT DETECTED Copies/mL
HIV-1 RNA Quant, Log: NOT DETECTED Log cps/mL

## 2021-11-23 LAB — HEPATITIS C ANTIBODY
Hepatitis C Ab: NONREACTIVE
SIGNAL TO CUT-OFF: 0.07 (ref <1.00)

## 2021-11-23 LAB — HIV ANTIBODY (ROUTINE TESTING W REFLEX): HIV 1&2 Ab, 4th Generation: NONREACTIVE

## 2021-12-08 ENCOUNTER — Ambulatory Visit: Payer: 59 | Admitting: Internal Medicine

## 2023-05-02 IMAGING — DX DG CHEST 2V
2 series · 2 of 2 positions shown · non-contrast
Comparison: None.

CLINICAL DATA: Persistent cough

EXAM:
CHEST - 2 VIEW

[chest pa]
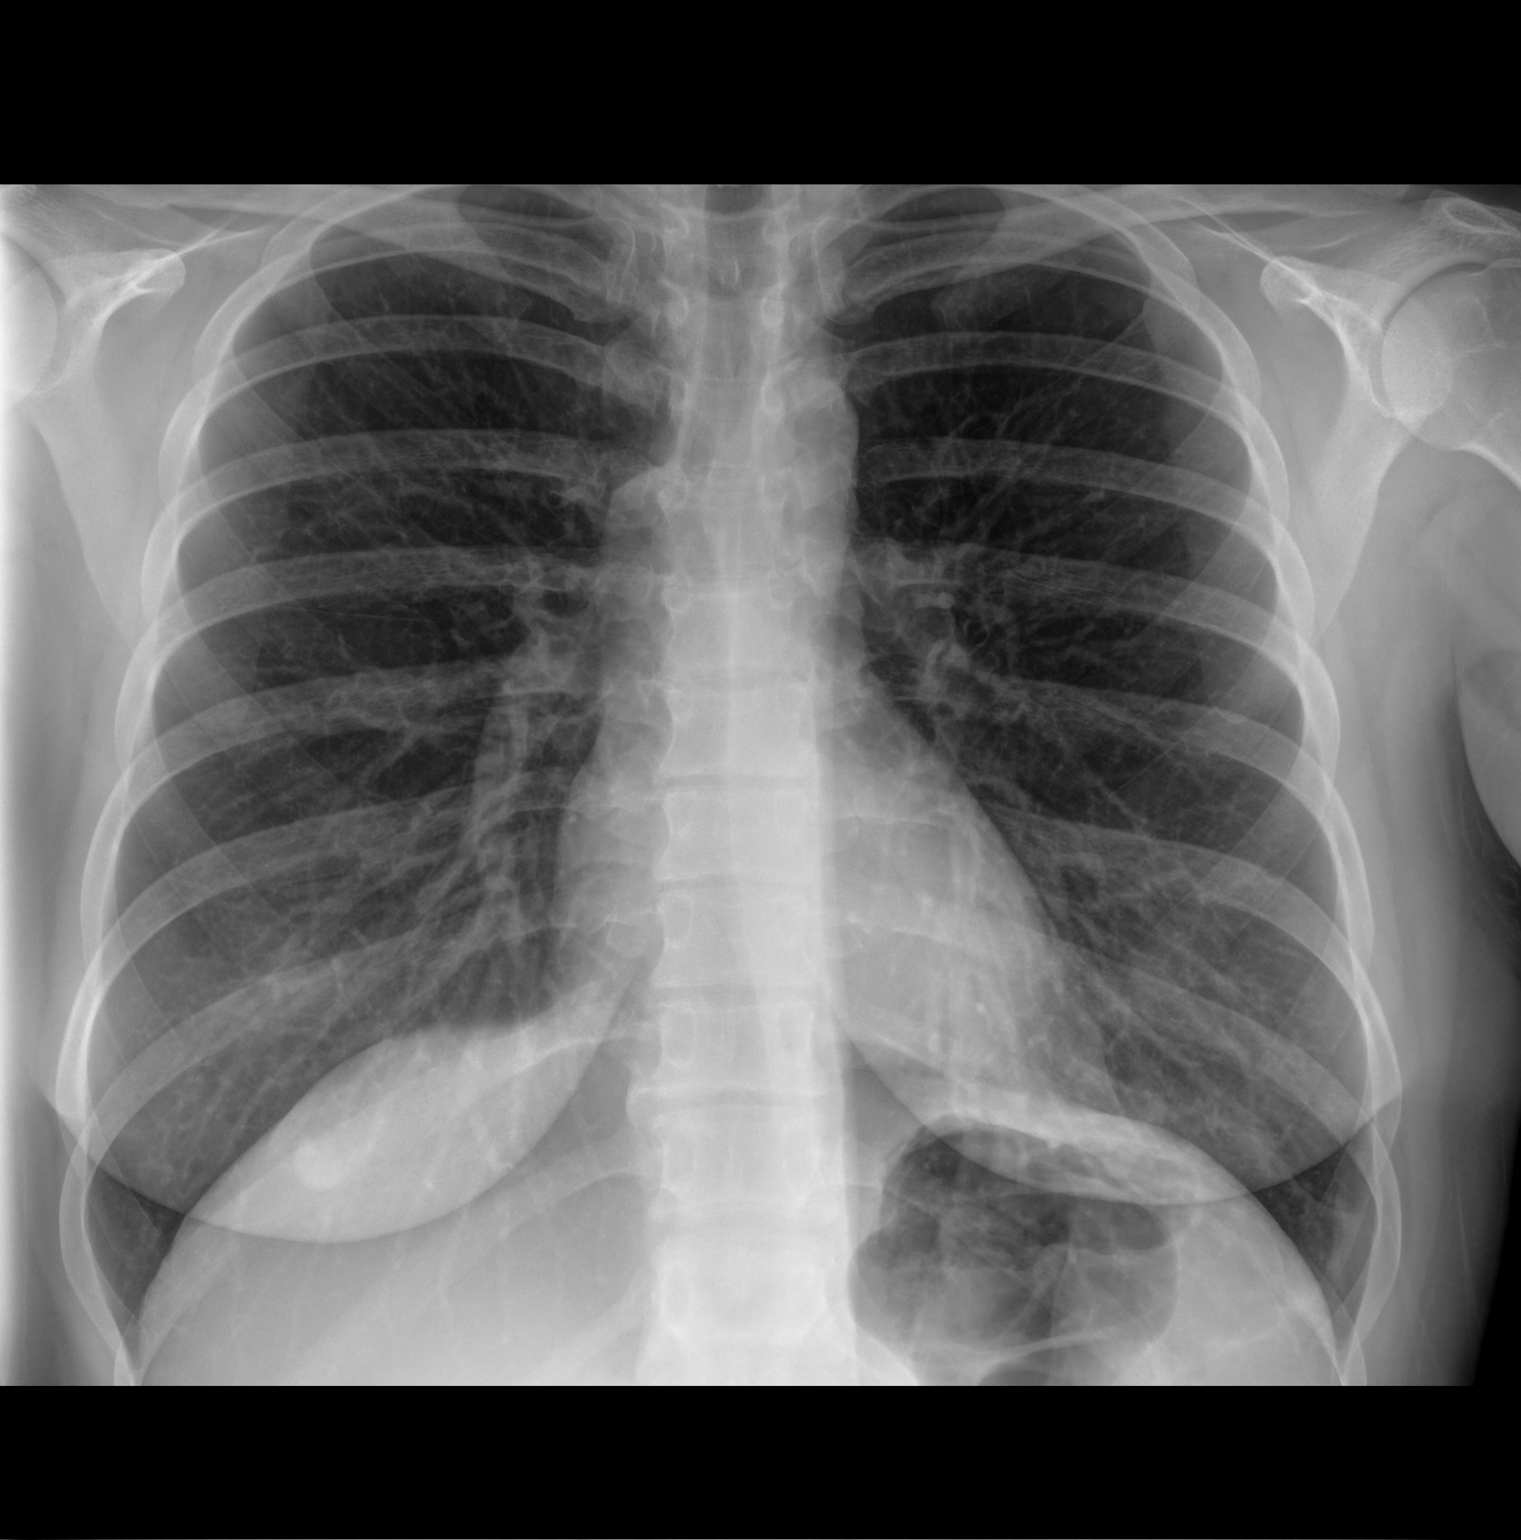

[chest lat]
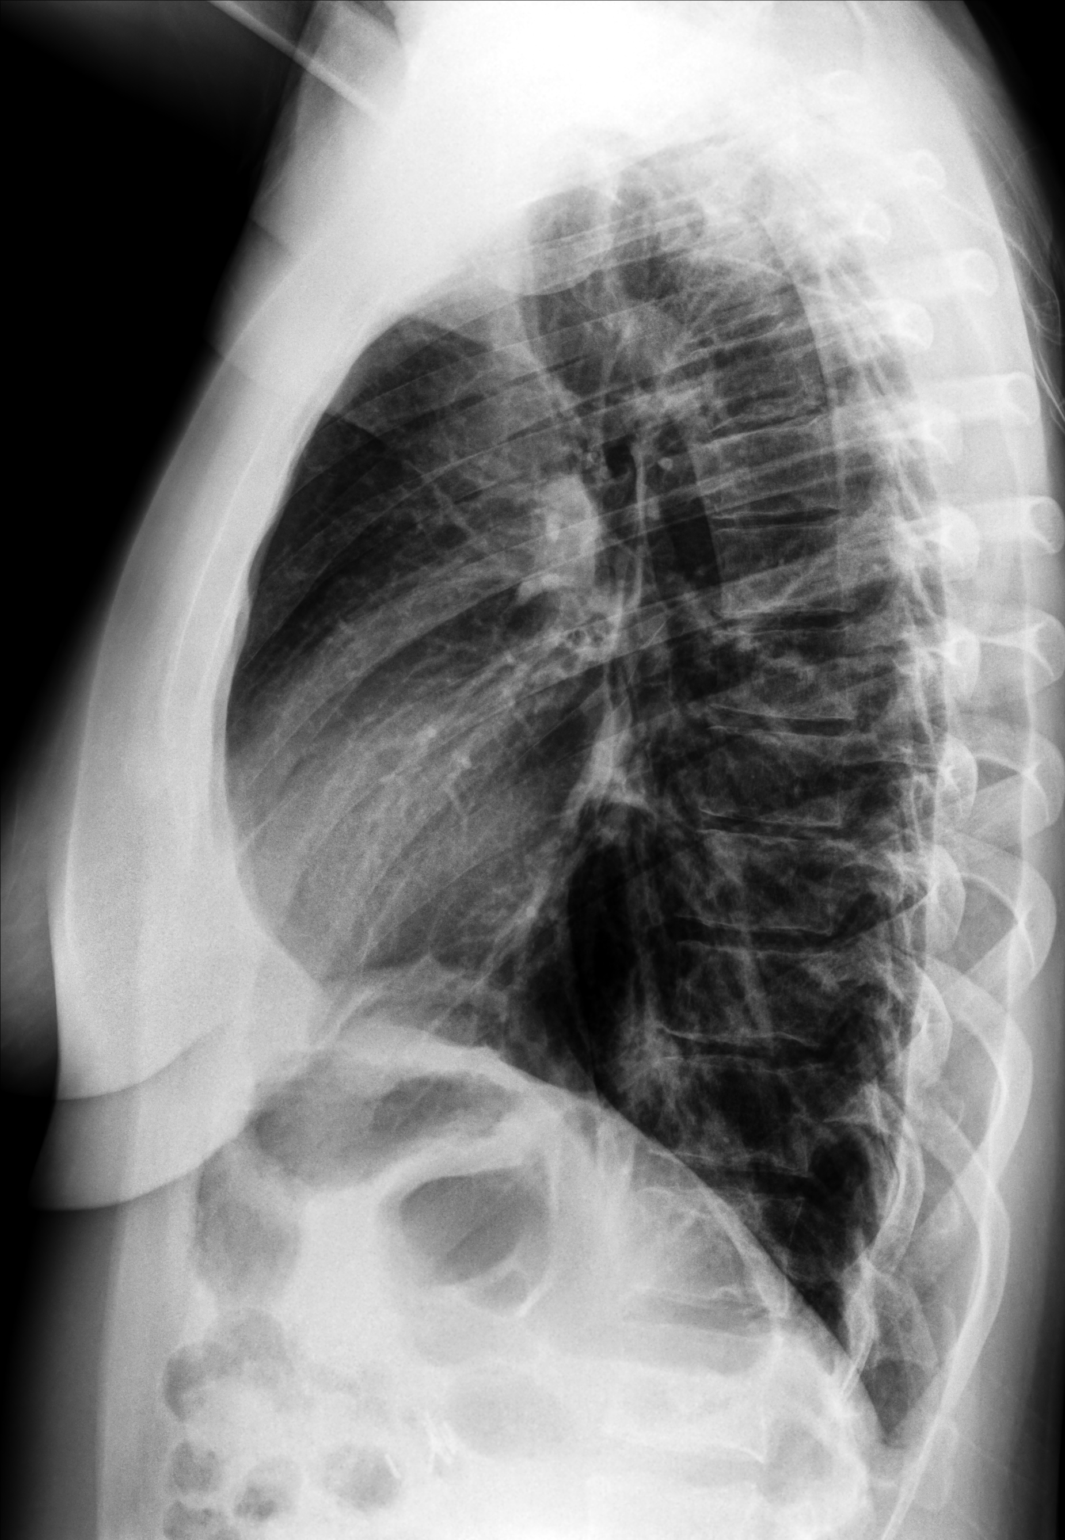

[2 of 2 positions shown; findings below may reference images not displayed]

FINDINGS: The heart size and mediastinal contours are within normal limits.
Both lungs are clear. The visualized skeletal structures are
unremarkable.
IMPRESSION: No active cardiopulmonary disease.
